# Patient Record
Sex: Female | Born: 1975
Health system: Southern US, Community
[De-identification: ages and names within clinical notes are randomized; demographics above are authoritative.]

## PROBLEM LIST (undated history)

## (undated) DIAGNOSIS — Z973 Presence of spectacles and contact lenses: Secondary | ICD-10-CM

## (undated) DIAGNOSIS — K219 Gastro-esophageal reflux disease without esophagitis: Secondary | ICD-10-CM

## (undated) DIAGNOSIS — T7840XA Allergy, unspecified, initial encounter: Secondary | ICD-10-CM

## (undated) DIAGNOSIS — D649 Anemia, unspecified: Secondary | ICD-10-CM

## (undated) DIAGNOSIS — Z114 Encounter for screening for human immunodeficiency virus [HIV]: Secondary | ICD-10-CM

## (undated) DIAGNOSIS — Z87440 Personal history of urinary (tract) infections: Secondary | ICD-10-CM

## (undated) DIAGNOSIS — Z87442 Personal history of urinary calculi: Secondary | ICD-10-CM

## (undated) HISTORY — DX: Encounter for screening for human immunodeficiency virus (HIV): Z11.4

## (undated) HISTORY — DX: Gastro-esophageal reflux disease without esophagitis: K21.9

## (undated) HISTORY — DX: Personal history of urinary (tract) infections: Z87.440

## (undated) HISTORY — DX: Allergy, unspecified, initial encounter: T78.40XA

---
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
## 2008-03-07 ENCOUNTER — Inpatient Hospital Stay (HOSPITAL_COMMUNITY): Admission: AD | Admit: 2008-03-07 | Discharge: 2008-03-12 | Payer: Self-pay | Admitting: Obstetrics and Gynecology

## 2008-03-09 ENCOUNTER — Encounter (INDEPENDENT_AMBULATORY_CARE_PROVIDER_SITE_OTHER): Payer: Self-pay | Admitting: Obstetrics and Gynecology

## 2010-10-15 NOTE — Discharge Summary (Signed)
Gabrielle Zamora, Gabrielle Zamora                ACCOUNT NO.:  [id number]   MEDICAL RECORD NO.:  [id number]          PATIENT TYPE:  INP   LOCATION:  9148                          FACILITY:  [facility]   PHYSICIAN:  Huel Cote, M.D. DATE OF BIRTH:  1976/02/26   DATE OF ADMISSION:  03/07/2008  DATE OF DISCHARGE:  03/12/2008                               DISCHARGE SUMMARY   DISCHARGE DIAGNOSES:  1. Term pregnancy at 40-3/7 weeks delivered.  2. Status post failed induction with arrest of dilation relatively of      5-cm also nonreassuring fetal heart rate and chorioamnionitis.  3. Status post low transverse cesarean section.   DISCHARGE MEDICATIONS:  1. Motrin 600 mg p.o. over 6 hours.  2. Percocet 1-2 tablets p.o. every 4 hours p.r.n.   DISCHARGE FOLLOWUP:  The patient is to follow up in the office in 2  weeks for an incision check.   HOSPITAL COURSE:  The patient is a 35 year old G2, P0 who came in at 40  plus weeks gestation for attempted induction of labor secondary to  postterm, however, her cervix was somewhat unfavorable, external os was  1, internal os was closed, and she was 50% effaced on admission.  She  came in and was placed on Cervidil to try to ripen her cervix.  For a  full dictated H&P, please see that which was done previously by Dr.  Ellyn Hack.  After her Cervidil ripening, her cervix did improved to 1+,  80%, and -3.  She was given some Stadol for pain control; continued  contracting and reached 2-3 cm, 50%, and -2 and had rupture of membranes  for clear fluid.  She then continued to progress and reached  approximately 4-5 cm.  This was approximately 12 hours and to Pitocin.  At that point, she was started to having some decelerations, 1 notably  down to the 80s.  She also developed a temperature of 101.6, therefore,  was placed on antibiotic prophylaxis for chorio.  Several hours later,  the patient had no cervical change and her temperature remained high  with intermittent fetal  decelerations.  Given her lack of progress and  the less reassuring fetal tracing, the patient was counseled and  underwent a primary low transverse C-section; was delivered a viable  female infant, Apgars were 9 and 9, and weight was 8 pounds 10 ounces.  Cord pH was 7.22.  She underwent the surgery without difficulty and was  placed on ampicillin, gentamicin, and clindamycin for chorioamnionitis,  which she stayed on for approximately 24 hour plus.  She defervesced  very quickly after delivery and never reached spike temperature,  therefore, after 24 hours for antibiotics were discontinued.  Her  hemoglobin was 7.6, which was down just from 9.2 and the patient was  completely asymptomatic with this; therefore, she was counseled as to  her options and just went on some iron therapy, which was felt most  reasonable approach.  She stayed in-house until postop day #3 and at  that point was doing well.  She had no significant complaints.  Her  fundus was  firm.  Her incision was clear with her staples out.  Steri-  Strips were placed and she was felt stable for discharge home.  She is  to follow up in the office in 2 weeks with Dr. Ellyn Hack for an incision  check.     Huel Cote, M.D.  Electronically Signed    KR/MEDQ  D:  03/12/2008  T:  03/12/2008  Job:  [id number]

## 2010-10-15 NOTE — Op Note (Signed)
NAME:  Gabrielle Zamora, Gabrielle Zamora                ACCOUNT NO.:  [id number]   MEDICAL RECORD NO.:  [id number]          PATIENT TYPE:  INP   LOCATION:  9148                          FACILITY:  [facility]   PHYSICIAN:  Sherron Monday, MD        DATE OF BIRTH:  [birth year]   DATE OF PROCEDURE:  03/09/2008  DATE OF DISCHARGE:                               OPERATIVE REPORT   PREOPERATIVE DIAGNOSES:  Intrauterine pregnancy at term, failure to  progress, nonreassuring fetal heart tracing, and chorioamnionitis.   POSTOPERATIVE DIAGNOSES:  Intrauterine pregnancy at term, failure to  progress, nonreassuring fetal heart tracing, and chorioamnionitis;  delivered.   ANESTHESIA:  Epidural.   SURGEON:  Sherron Monday, MD.   PROCEDURE:  Primary low transverse cesarean section.   FINDINGS:  Viable female infant at 2:28 with Apgars of 9 at 1 minute and  9 at 5 minutes and a weight of 8 pounds 10 ounces and an arterial cord  pH of 7.22.  Placenta was delivered intact at 2:30.  Normal uterus,  tubes, and ovaries were noted.   COMPLICATIONS:  None.   PATHOLOGY:  Placenta.   IV FLUIDS:  1800 mL.   EBL:  600 mL.   URINE OUTPUT:  200 mL clear at the end of the procedure.   DISPOSITION:  Stable to PACU.   DESCRIPTION OF PROCEDURE:  After informed consent was reviewed with the  patient including risks, benefits, and alternatives of cesarean section,  she was transported to the OR.  Her epidural was redosed.  She was  placed on the table in supine position with a leftward tilt.  She was  prepped and draped in the normal sterile fashion.  A Pfannenstiel skin  incision was made approximately 2 fingerbreadths above the pubic  symphysis and carried through the underlying layer of fascia sharply.  The fascia was incised in the midline.  The incision was extended  laterally with Mayo scissors.  The inferior aspect of this fascial  incision was grasped with Kocher clamps, elevated, and the rectus  muscles were dissected off both  bluntly and sharply.  Attention was then  turned to the superior portion of the fascial incision was grasped with  Kocher clamps, elevated, and the rectus muscles were dissected off both  bluntly and sharply.  Midline was easily identified and the peritoneum  was entered bluntly.  Peritoneal incision was extended superiorly and  inferiorly with good visualization of bladder.  The Alexis skin  retractor was then placed and carefully checked to make sure no bowel  was entrapped.  The vesicouterine peritoneum was easily identified and a  bladder flap was created with Metzenbaum scissors with blunt and sharp  dissection.  The uterus was incised in transverse fashion.  The infant  was delivered from the vertex presentation.  The cord was clamped and  cut on the field and nose and mouth were suctioned.  The infant was  handed over to the awaiting pediatric staff.  Placenta was then  expressed from the uterus.  Uterus was cleared of all clots and debris.  The uterine  incision was outlined with ring forceps and closed with 2  layers of 0 Monocryl, the first of which was a running locked and the  second was an imbricating.  Hemostasis was assured.  Copious irrigation  of the pelvis was performed.  The subfascial planes were inspected and  found to be hemostatic.  The fascia was then closed with 0 Vicryl in a  running fashion.  Subcuticular adipose layer was made hemostatic with  Bovie cautery and was irrigated, then reapproxaimated with 2-0 plain  gut.  The skin was closed with staples.  Sponge, lap, and needle counts  were correct x2 at the end of procedure.  The patient tolerated the  procedure well.      Sherron Monday, MD  Electronically Signed     JB/MEDQ  D:  03/09/2008  T:  03/09/2008  Job:  [id number]

## 2010-10-15 NOTE — H&P (Signed)
Gabrielle Zamora, Gabrielle Zamora                ACCOUNT NO.:  [id number]   MEDICAL RECORD NO.:  [id number]         PATIENT TYPE:  WHINP   LOCATION:                                 FACILITY:   PHYSICIAN:  Sherron Monday, MD        DATE OF BIRTH:  Jun 14, 1975   DATE OF ADMISSION:  03/07/2008  DATE OF DISCHARGE:                              HISTORY & PHYSICAL   ADMITTING DIAGNOSIS:  Intrauterine pregnancy at 40 plus 3 weeks with  unfavorable cervix for induction of labor.   HISTORY OF PRESENT ILLNESS:  A 35 year old G2, P0 at 40 plus 3 weeks  with induction of labor secondary to post term with unfavorable cervix.  She has had good fetal movement.  No loss of fluid, no vaginal bleeding.  Occasional contractions.  Uncomplicated prenatal care to this point,  except for a previa that resolved in her prenatal care.   PAST MEDICAL HISTORY:  Not significant.   PAST SURGICAL HISTORY:  Significant for an adenoidectomy at age 35.   PAST OBSTETRIC AND GYNECOLOGIC HISTORY:  G1 was a miscarriage.  G2 is  the present pregnancy with an [facility] of March 04, 2008. no abnormal pap  smears or sexually transmitted diseases.   MEDICATIONS:  Prenatal vitamins.   ALLERGIES:  SULFA with a questionable reaction.   SOCIAL HISTORY:  The patient denies alcohol, tobacco or drug use and is  married.   FAMILY HISTORY:  Significant for heart disease in maternal grandfather  and father, chronic hypertension in father, depression in her father, as  well.   PRENATAL LABORATORIES:  Hemoglobin 11.7, platelets 290.  RPR  nonreactive.  Hepatitis B surface antigen negative.  Rubella nonimmune.  HIV nonreactive.  She had an E. coli UTI that was treated.  Pap smear  within normal limits.  She is A+, antibody screen negative, gonorrhea  negative, chlamydia negative, cystic fibrosis screen negative first  trimester screen negative, AFP negative.  One-hour GTT of 131.  Group B  strep was negative.  Ultrasound performed at 27 and 6 weeks  reveals an  estimated fetal weight of 2 pounds 11 ounces with previa being resolved  and a normal AFI.  Ultrasound performed at 19 weeks revealed normal  anatomy except an EIF and anterior placenta with a marginal previa.   PHYSICAL EXAMINATION:  VITAL SIGNS:  Afebrile.  Vital signs stable.  GENERAL:  No apparent distress.  CARDIOVASCULAR:  Regular rate and rhythm.  LUNGS:  Clear to auscultation bilaterally.  ABDOMEN:  Soft,  Fundus nontender.  EXTREMITIES:  Symmetric and nontender.  Fetal heart tones are normal in  the 140s and 150s.  VAGINAL EXAM:  External os is 1 cm, internal os closed, 50% effaced, -2  station and soft.   ASSESSMENT/PLAN:  A 35 year old G2, P0 at 40+ weeks for induction of  labor with Cervidil.  She is group B strep negative, so does not need  group B strep prophylaxis.  She will be admitted, given Cervidil, and  then in the morning when it is removed, an artificial rupture of  membranes will be  performed and Pitocin will be started.  Risks,  benefits and alternatives were discussed with the patient.  Patient  voices understanding to the plan.      Sherron Monday, MD  Electronically Signed     JB/MEDQ  D:  03/07/2008  T:  03/07/2008  Job:  [id number]

## 2011-03-04 LAB — CREATININE, SERUM
Creatinine, Ser: 0.75
GFR calc Af Amer: >60

## 2011-03-04 LAB — CBC
Hemoglobin: 7.6 — CL
MCHC: 33
MCHC: 33.4
MCV: 83.7
Platelets: 299
RBC: 2.7 — ABNORMAL LOW
RDW: 14.1
RDW: 14.6

## 2016-01-01 ENCOUNTER — Other Ambulatory Visit: Payer: Self-pay | Admitting: Physician Assistant

## 2016-01-01 DIAGNOSIS — Z1231 Encounter for screening mammogram for malignant neoplasm of breast: Secondary | ICD-10-CM

## 2016-01-10 ENCOUNTER — Ambulatory Visit: Payer: Self-pay

## 2018-03-24 ENCOUNTER — Encounter: Payer: Self-pay | Admitting: Family Medicine

## 2018-03-24 ENCOUNTER — Ambulatory Visit (INDEPENDENT_AMBULATORY_CARE_PROVIDER_SITE_OTHER): Payer: Self-pay | Admitting: Family Medicine

## 2018-03-24 VITALS — BP 106/74 | HR 69 | Temp 98.2°F | Ht 69.5 in | Wt 169.6 lb

## 2018-03-24 DIAGNOSIS — E559 Vitamin D deficiency, unspecified: Secondary | ICD-10-CM

## 2018-03-24 DIAGNOSIS — Z975 Presence of (intrauterine) contraceptive device: Secondary | ICD-10-CM | POA: Insufficient documentation

## 2018-03-24 DIAGNOSIS — Z0000 Encounter for general adult medical examination without abnormal findings: Secondary | ICD-10-CM | POA: Diagnosis not present

## 2018-03-24 DIAGNOSIS — Z23 Encounter for immunization: Secondary | ICD-10-CM | POA: Diagnosis not present

## 2018-03-24 DIAGNOSIS — Z8249 Family history of ischemic heart disease and other diseases of the circulatory system: Secondary | ICD-10-CM | POA: Diagnosis not present

## 2018-03-24 LAB — COMPREHENSIVE METABOLIC PANEL
ALK PHOS: 54 U/L (ref 39–117)
ALT: 8 U/L (ref 0–35)
AST: 12 U/L (ref 0–37)
Albumin: 4.6 g/dL (ref 3.5–5.2)
BUN: 7 mg/dL (ref 6–23)
CO2: 29 mEq/L (ref 19–32)
Calcium: 9.4 mg/dL (ref 8.4–10.5)
Chloride: 104 mEq/L (ref 96–112)
Creatinine, Ser: 0.7 mg/dL (ref 0.40–1.20)
GFR: 97.48 mL/min (ref >60.00)
GLUCOSE: 97 mg/dL (ref 70–99)
Potassium: 4.4 mEq/L (ref 3.5–5.1)
Sodium: 138 mEq/L (ref 135–145)
TOTAL PROTEIN: 7.3 g/dL (ref 6.0–8.3)
Total Bilirubin: 0.5 mg/dL (ref 0.2–1.2)

## 2018-03-24 LAB — CBC WITH DIFFERENTIAL/PLATELET
BASOS ABS: 0 10*3/uL (ref 0.0–0.1)
BASOS PCT: 0.7 % (ref 0.0–3.0)
EOS PCT: 2.5 % (ref 0.0–5.0)
Eosinophils Absolute: 0.1 10*3/uL (ref 0.0–0.7)
HCT: 39.6 % (ref 36.0–46.0)
Hemoglobin: 13.4 g/dL (ref 12.0–15.0)
LYMPHS ABS: 2.1 10*3/uL (ref 0.7–4.0)
Lymphocytes Relative: 35 % (ref 12.0–46.0)
MCHC: 33.9 g/dL (ref 30.0–36.0)
MCV: 89.6 fl (ref 78.0–100.0)
MONO ABS: 0.4 10*3/uL (ref 0.1–1.0)
MONOS PCT: 6.5 % (ref 3.0–12.0)
NEUTROS PCT: 55.3 % (ref 43.0–77.0)
Neutro Abs: 3.3 10*3/uL (ref 1.4–7.7)
Platelets: 275 10*3/uL (ref 150.0–400.0)
RBC: 4.43 Mil/uL (ref 3.87–5.11)
RDW: 12.7 % (ref 11.5–15.5)
WBC: 5.9 10*3/uL (ref 4.0–10.5)

## 2018-03-24 LAB — LIPID PANEL
Cholesterol: 146 mg/dL (ref 0–200)
HDL: 59 mg/dL (ref >39.00)
LDL Cholesterol: 73 mg/dL (ref 0–99)
NONHDL: 87.21
Total CHOL/HDL Ratio: 2
Triglycerides: 69 mg/dL (ref 0.0–149.0)
VLDL: 13.8 mg/dL (ref 0.0–40.0)

## 2018-03-24 LAB — VITAMIN D 25 HYDROXY (VIT D DEFICIENCY, FRACTURES): VITD: 41.39 ng/mL (ref 30.00–100.00)

## 2018-03-24 LAB — TSH: TSH: 0.37 u[IU]/mL (ref 0.35–4.50)

## 2018-03-24 NOTE — Progress Notes (Signed)
Patient: Gabrielle Zamora MRN: [id number] DOB: 06-23-1975 PCP: Orland Mustard, MD     Subjective:  Chief Complaint  Patient presents with  . Establish Care    HPI: The patient is a 42 y.o. female who presents today for annual exam. She denies any changes to past medical history. There have been no recent hospitalizations. They are following a well balanced diet and exercise plan. Weight has been stable. No complaints today. No FH of breast cancer or colon cancer. Father had a MI and triple bypass in his 79s. She thinks his first heart attack was before the age of 50.   Immunization History  Administered Date(s) Administered  . Tdap 03/24/2018    Mammogram: 01/2017. Normal   Pap smear: 01/2017. Normal  Tdap: today  hiv screen: negative  Flu shot: declined   Review of Systems  Constitutional: Positive for fatigue. Negative for chills and fever.  HENT: Negative for dental problem, ear pain, hearing loss and trouble swallowing.   Eyes: Negative for visual disturbance.  Respiratory: Negative for cough, chest tightness and shortness of breath.   Cardiovascular: Negative for chest pain, palpitations and leg swelling.  Gastrointestinal: Negative for abdominal pain, blood in stool, diarrhea and nausea.  Endocrine: Negative for cold intolerance, polydipsia, polyphagia and polyuria.  Genitourinary: Negative for dysuria and hematuria.  Musculoskeletal: Negative for arthralgias, back pain and neck pain.  Skin: Negative.  Negative for rash.  Neurological: Negative for dizziness and headaches.  Psychiatric/Behavioral: Negative for dysphoric mood and sleep disturbance. The patient is not nervous/anxious.     Allergies Patient has No Known Allergies.  Past Medical History Patient  has a past medical history of Allergy, GERD (gastroesophageal reflux disease), History of recurrent UTIs, and Screening for HIV (human immunodeficiency virus).  Surgical History Patient  has a past surgical history  that includes Cesarean section (2009, 2012) and Adenoidectomy (1985).  Family History Pateint's family history includes Asthma in her daughter, daughter, and son; Depression in her father; Diabetes in her brother; Heart attack in her father and maternal grandfather; Heart disease in her father and maternal grandfather; Hyperlipidemia in her father; Hypertension in her father; Mental illness in her father.  Social History Patient  reports that she has quit smoking. Her smoking use included cigarettes. She has never used smokeless tobacco. She reports that she drinks alcohol. She reports that she does not use drugs.    Objective: Vitals:   03/24/18 0917  BP: 106/74  Pulse: 69  Temp: 98.2 F (36.8 C)  TempSrc: Oral  SpO2: 97%  Weight: 169 lb 9.6 oz (76.9 kg)  Height: 5' 9.5" (1.765 m)    Body mass index is 24.69 kg/m.  Physical Exam  Constitutional: She is oriented to person, place, and time. She appears well-developed and well-nourished.  HENT:  Right Ear: External ear normal.  Left Ear: External ear normal.  Mouth/Throat: Oropharynx is clear and moist.  Eyes: Pupils are equal, round, and reactive to light. Conjunctivae and EOM are normal.  Neck: Normal range of motion. Neck supple. No thyromegaly present.  Cardiovascular: Normal rate, regular rhythm, normal heart sounds and intact distal pulses.  No murmur heard. Pulmonary/Chest: Effort normal and breath sounds normal.  Abdominal: Soft. Bowel sounds are normal. She exhibits no distension. There is no tenderness.  Lymphadenopathy:    She has no cervical adenopathy.  Neurological: She is alert and oriented to person, place, and time. She displays normal reflexes. No cranial nerve deficit. Coordination normal.  Skin: Skin is warm  and dry. No rash noted.  Psychiatric: She has a normal mood and affect. Her behavior is normal.  Vitals reviewed.      Depression screen [facility] 2/9 03/24/2018  Decreased Interest 0  Down, Depressed,  Hopeless 0  PHQ - 2 Score 0    Assessment/plan: 1. Annual physical exam Doing great. Continue healthy lifestyle, but encouraged her to start working out. Routine lab work today. F/u in one year or as needed.  Patient counseling [x]    Nutrition: Stressed importance of moderation in sodium/caffeine intake, saturated fat and cholesterol, caloric balance, sufficient intake of fresh fruits, vegetables, fiber, calcium, iron, and 1 mg of folate supplement per day (for females capable of pregnancy).  [x]    Stressed the importance of regular exercise.   [x]    Substance Abuse: Discussed cessation/primary prevention of tobacco, alcohol, or other drug use; driving or other dangerous activities under the influence; availability of treatment for abuse.   [x]    Injury prevention: Discussed safety belts, safety helmets, smoke detector, smoking near bedding or upholstery.   [x]    Sexuality: Discussed sexually transmitted diseases, partner selection, use of condoms, avoidance of unintended pregnancy  and contraceptive alternatives.  [x]    Dental health: Discussed importance of regular tooth brushing, flossing, and dental visits.  [x]    Health maintenance and immunizations reviewed. Please refer to Health maintenance section.    - CBC with Differential/Platelet - Comprehensive metabolic panel - Lipid panel - TSH  2. Need for prophylactic vaccination with combined diphtheria-tetanus-pertussis (DTP) vaccine  - Tdap vaccine greater than or equal to 7yo IM  3. Vitamin D deficiency  - VITAMIN D 25 Hydroxy (Vit-D Deficiency, Fractures)  4. Family hx of early CAD in father -will find out age and see if we need to refer for this.   Return in about 1 year (around 03/25/2019).    Orland Mustard, MD [facility]  03/24/2018

## 2018-03-25 ENCOUNTER — Other Ambulatory Visit: Payer: Self-pay | Admitting: Family Medicine

## 2018-03-25 DIAGNOSIS — Z8249 Family history of ischemic heart disease and other diseases of the circulatory system: Secondary | ICD-10-CM

## 2018-06-30 ENCOUNTER — Encounter: Payer: Self-pay | Admitting: Podiatry

## 2018-06-30 ENCOUNTER — Ambulatory Visit (INDEPENDENT_AMBULATORY_CARE_PROVIDER_SITE_OTHER): Payer: BLUE CROSS/BLUE SHIELD | Admitting: Podiatry

## 2018-06-30 VITALS — BP 127/83 | HR 67 | Resp 16

## 2018-06-30 DIAGNOSIS — M2041 Other hammer toe(s) (acquired), right foot: Secondary | ICD-10-CM | POA: Diagnosis not present

## 2018-06-30 DIAGNOSIS — B351 Tinea unguium: Secondary | ICD-10-CM | POA: Diagnosis not present

## 2018-06-30 LAB — HEPATIC FUNCTION PANEL
AG RATIO: 1.7 (calc) (ref 1.0–2.5)
ALT: 7 U/L (ref 6–29)
AST: 13 U/L (ref 10–30)
Albumin: 4.5 g/dL (ref 3.6–5.1)
Alkaline phosphatase (APISO): 53 U/L (ref 33–115)
BILIRUBIN INDIRECT: 0.3 mg/dL (ref 0.2–1.2)
BILIRUBIN TOTAL: 0.4 mg/dL (ref 0.2–1.2)
Bilirubin, Direct: 0.1 mg/dL (ref 0.0–0.2)
GLOBULIN: 2.6 g/dL (ref 1.9–3.7)
TOTAL PROTEIN: 7.1 g/dL (ref 6.1–8.1)

## 2018-06-30 MED ORDER — TERBINAFINE HCL 250 MG PO TABS: 250.0000 mg | ORAL_TABLET | Freq: Every day | ORAL | 0 refills | Status: DC

## 2018-06-30 NOTE — Progress Notes (Signed)
   Subjective:    Patient ID: Gabrielle Zamora, female    DOB: 1975-06-22, 43 y.o.   MRN: [id number]  HPI    Review of Systems  All other systems reviewed and are negative.      Objective:   Physical Exam        Assessment & Plan:

## 2018-06-30 NOTE — Progress Notes (Signed)
Subjective:   Patient ID: Gabrielle Zamora, female   DOB: 43 y.o.   MRN: [id number]   HPI Patient states that I had discoloration of my big toenails on both feet and it is been going on for a number years with the right being worse than the left.  Would like to have it treated if possible and also broke her heel bone 3 years ago and has a small digital deformity of her fourth right toe   Review of Systems  All other systems reviewed and are negative.       Objective:  Physical Exam Vitals signs and nursing note reviewed.  Constitutional:      Appearance: She is well-developed.  Pulmonary:     Effort: Pulmonary effort is normal.  Musculoskeletal: Normal range of motion.  Skin:    General: Skin is warm.  Neurological:     Mental Status: She is alert.     Neurovascular status intact muscle strength is adequate range of motion within normal limits with patient noted to have discoloration of the hallux nail bilateral distal two thirds of the bed with mild looseness and lifting of the nail plate itself.  Patient is also noted to have reduced range of motion subtalar joint right and elevated fourth digit right foot with mild discomfort in the sinus tarsi right.  Good digital perfusion well oriented x3     Assessment:  Probability for traumatic nail disease with nail deformity with probability also for mycotic component along with marked subtalar joint arthritis hammertoe deformity     Plan:  H&P all conditions reviewed and education concerning possibility for long-term arthritis given to patient.  As far as the nails I have recommended laser therapy along with oral medication and topical medicine.  I did give instructions for liver function studies and gave her a requisition form for this today and I also wrote her prescription for Lamisil 90 days with instructions on risk associated with it.  Scheduled for laser therapy of the hallux nails in the next week or 2

## 2018-07-01 ENCOUNTER — Ambulatory Visit: Payer: Self-pay

## 2018-07-01 DIAGNOSIS — B351 Tinea unguium: Secondary | ICD-10-CM

## 2018-07-01 DIAGNOSIS — M2041 Other hammer toe(s) (acquired), right foot: Secondary | ICD-10-CM

## 2018-07-06 NOTE — Progress Notes (Signed)
Pt presents with mycotic infection of nails 1-5 bilateral.  All other systems are negative  Laser therapy administered to affected nails and tolerated well. All safety precautions were in place.  2nd treatment.  Follow up in 4 weeks     

## 2018-07-20 ENCOUNTER — Ambulatory Visit (INDEPENDENT_AMBULATORY_CARE_PROVIDER_SITE_OTHER): Payer: BLUE CROSS/BLUE SHIELD | Admitting: Internal Medicine

## 2018-07-20 ENCOUNTER — Encounter: Payer: Self-pay | Admitting: Internal Medicine

## 2018-07-20 VITALS — BP 114/76 | HR 66 | Ht 70.0 in | Wt 169.0 lb

## 2018-07-20 DIAGNOSIS — Z9189 Other specified personal risk factors, not elsewhere classified: Secondary | ICD-10-CM | POA: Diagnosis not present

## 2018-07-20 DIAGNOSIS — Z8249 Family history of ischemic heart disease and other diseases of the circulatory system: Secondary | ICD-10-CM

## 2018-07-20 NOTE — Patient Instructions (Signed)
Your physician recommends that you continue on your current medications as directed. Please refer to the Current Medication list given to you today.  Lab Today: [facility]   Dr. Rennis Golden recommends that you schedule a follow up visit with him the in the [facility] in 12 months. Please have fasting blood work about 1 week prior to this visit and he will review the blood work results with you at your appointment.    Dr.Hilty has ordered a CT coronary calcium score. This test is done at [address]. This is $150 out of pocket.   Coronary CalciumScan A coronary calcium scan is an imaging test used to look for deposits of calcium and other fatty materials (plaques) in the inner lining of the blood vessels of the heart (coronary arteries). These deposits of calcium and plaques can partly clog and narrow the coronary arteries without producing any symptoms or warning signs. This puts a person at risk for a heart attack. This test can detect these deposits before symptoms develop. Tell a health care provider about:  Any allergies you have.  All medicines you are taking, including vitamins, herbs, eye drops, creams, and over-the-counter medicines.  Any problems you or family members have had with anesthetic medicines.  Any blood disorders you have.  Any surgeries you have had.  Any medical conditions you have.  Whether you are pregnant or may be pregnant. What are the risks? Generally, this is a safe procedure. However, problems may occur, including:  Harm to a pregnant woman and her unborn baby. This test involves the use of radiation. Radiation exposure can be dangerous to a pregnant woman and her unborn baby. If you are pregnant, you generally should not have this procedure done.  Slight increase in the risk of cancer. This is because of the radiation involved in the test. What happens before the procedure? No preparation is needed for this procedure. What happens during the  procedure?  You will undress and remove any jewelry around your neck or chest.  You will put on a hospital gown.  Sticky electrodes will be placed on your chest. The electrodes will be connected to an electrocardiogram (ECG) machine to record a tracing of the electrical activity of your heart.  A CT scanner will take pictures of your heart. During this time, you will be asked to lie still and hold your breath for 2-3 seconds while a picture of your heart is being taken. The procedure may vary among health care providers and hospitals. What happens after the procedure?  You can get dressed.  You can return to your normal activities.  It is up to you to get the results of your test. Ask your health care provider, or the department that is doing the test, when your results will be ready. Summary  A coronary calcium scan is an imaging test used to look for deposits of calcium and other fatty materials (plaques) in the inner lining of the blood vessels of the heart (coronary arteries).  Generally, this is a safe procedure. Tell your health care provider if you are pregnant or may be pregnant.  No preparation is needed for this procedure.  A CT scanner will take pictures of your heart.  You can return to your normal activities after the scan is done. This information is not intended to replace advice given to you by your health care provider. Make sure you discuss any questions you have with your health care provider. Document  Released: 11/15/2007 Document Revised: 04/07/2016 Document Reviewed: 04/07/2016 Elsevier Interactive Patient Education  2017 [employer].

## 2018-07-22 ENCOUNTER — Encounter: Payer: Self-pay | Admitting: Internal Medicine

## 2018-07-22 DIAGNOSIS — Z8249 Family history of ischemic heart disease and other diseases of the circulatory system: Secondary | ICD-10-CM | POA: Insufficient documentation

## 2018-07-22 LAB — LIPOPROTEIN A (LPA): Lipoprotein (a): 42.5 nmol/L (ref <75.0)

## 2018-07-22 NOTE — Progress Notes (Signed)
LIPID CLINIC CONSULT NOTE  Chief Complaint:  Referral for family history of coronary disease  Primary Care Physician: Orland Mustard, MD  Primary Cardiologist:  No primary care provider on file.  HPI:  Gabrielle Zamora is a 43 y.o. female who is being seen today for the evaluation of cardiovascular risk at the request of Orland Mustard, MD. This is a pleasant 43 year old female kindly referred to me by Dr. Artis Flock, for evaluation of cardiovascular risk.  Ailany has a strong family history of heart disease including her father who died of heart disease and had high cholesterol at age 43.  In addition her grandfather on her father's side had heart disease and died in his 49s.  She also has a brother who has diabetes.  She had 2 uncles who also died of heart attacks in their 42s.  Her personal risk factors seem to be minimal, and she does not have any history of hypertension, or diabetes.  Her only medication is Adderall, which she takes very infrequently.  Recent lab work from October 2019 showed total cholesterol 146, HDL 59, LDL 73 and triglycerides of 69.  Blood pressure is 114/76 today in the office.  Her BMI is 24.  She reports being physically active and denies any chest pain or worsening shortness of breath with exertion.  PMHx:  Past Medical History:  Diagnosis Date  . Allergy   . GERD (gastroesophageal reflux disease)   . History of recurrent UTIs   . Screening for HIV (human immunodeficiency virus)     Past Surgical History:  Procedure Laterality Date  . ADENOIDECTOMY  1985  . CESAREAN SECTION  2009, 2012    FAMHx:  Family History  Problem Relation Age of Onset  . Depression Father   . Heart attack Father   . Heart disease Father   . Hyperlipidemia Father   . Hypertension Father   . Mental illness Father   . Diabetes Brother   . Asthma Daughter   . Asthma Son   . Heart attack Maternal Grandfather   . Heart disease Maternal Grandfather   . Asthma Daughter      SOCHx:   reports that she has quit smoking. Her smoking use included cigarettes. She has never used smokeless tobacco. She reports current alcohol use. She reports that she does not use drugs.  ALLERGIES:  No Known Allergies  ROS: Pertinent items noted in HPI and remainder of comprehensive ROS otherwise negative.  HOME MEDS: Current Outpatient Medications on File Prior to Visit  Medication Sig Dispense Refill  . amphetamine-dextroamphetamine (ADDERALL) 10 MG tablet Take 10 mg by mouth daily with breakfast.    . Ergocalciferol (VITAMIN D2) 10 MCG (400 UNIT) TABS Vitamin D2    . levonorgestrel (MIRENA, 52 MG,) 20 MCG/24HR IUD Mirena 20 mcg/24 hours (5 yrs) 52 mg intrauterine device  Take 1 device by intrauterine route.    . terbinafine (LAMISIL) 250 MG tablet Take 1 tablet (250 mg total) by mouth daily. 90 tablet 0   No current facility-administered medications on file prior to visit.     LABS/IMAGING: No results found for this or any previous visit (from the past 48 hour(s)). No results found.  LIPID PANEL:    Component Value Date/Time   CHOL 146 03/24/2018 0942   TRIG 69.0 03/24/2018 0942   HDL 59.00 03/24/2018 0942   CHOLHDL 2 03/24/2018 0942   VLDL 13.8 03/24/2018 0942   LDLCALC 73 03/24/2018 0942    WEIGHTS: Wt Readings  from Last 3 Encounters:  07/20/18 169 lb (76.7 kg)  03/24/18 169 lb 9.6 oz (76.9 kg)    VITALS: BP 114/76 (BP Location: Right Arm)   Pulse 66   Ht 5\' 10"  (1.778 m)   Wt 169 lb (76.7 kg)   SpO2 97%   BMI 24.25 kg/m   EXAM: General appearance: alert and no distress Neck: no carotid bruit, no JVD and thyroid not enlarged, symmetric, no tenderness/mass/nodules Lungs: clear to auscultation bilaterally Heart: regular rate and rhythm, S1, S2 normal, no murmur, click, rub or gallop Abdomen: soft, non-tender; bowel sounds normal; no masses,  no organomegaly Extremities: extremities normal, atraumatic, no cyanosis or edema Pulses: 2+ and  symmetric Skin: Skin color, texture, turgor normal. No rashes or lesions Neurologic: Grossly normal Psych: Pleasant  EKG: Deferred  ASSESSMENT: 1. Family history of premature coronary disease 2. At risk for heart disease  PLAN: 1.   [name] has a family history of premature coronary disease and is at risk for heart disease although has no obvious coronary risk factors and is an asymptomatic at this point.  Based on family history alone she is at intermediate risk for coronary disease.  I think she would benefit from coronary artery calcium scoring to further determine her risk.  Her lipid profile appears to be very well controlled with diet.  There is no evidence for hypertension, diabetes and she was a former smoker but quit in 2004.  Given her family history of early onset heart disease, we will also check a LP(a) level.  If this is elevated, she may be a candidate for new therapies that are under clinical development.  We will contact her with the results of the studies and plan follow-up with me in 1 year.  Thanks again for the kind referral.  Chrystie Nose, MD, [facility]  [place]  [facility]  Medical Director of the [facility] Lipid Disorders &  Cardiovascular Risk Reduction Clinic Diplomate of the American Board of Clinical Lipidology Attending Cardiologist  Direct Dial: [phone]  Fax: [phone]  Website:  [email or web address] 07/22/2018, 5:34 PM

## 2018-07-29 ENCOUNTER — Ambulatory Visit: Payer: Self-pay

## 2018-07-29 DIAGNOSIS — B351 Tinea unguium: Secondary | ICD-10-CM

## 2018-07-30 NOTE — Progress Notes (Signed)
Pt presents with mycotic infection of nails 1-5 bilateral.  All other systems are negative  Laser therapy administered to affected nails and tolerated well. All safety precautions were in place.  3rd treatment.  Follow up in 4 weeks     

## 2018-08-01 DIAGNOSIS — Z20828 Contact with and (suspected) exposure to other viral communicable diseases: Secondary | ICD-10-CM | POA: Diagnosis not present

## 2018-08-12 ENCOUNTER — Inpatient Hospital Stay: Admission: RE | Admit: 2018-08-12 | Payer: BLUE CROSS/BLUE SHIELD | Source: Ambulatory Visit

## 2018-08-27 ENCOUNTER — Other Ambulatory Visit: Payer: BLUE CROSS/BLUE SHIELD

## 2018-11-20 DIAGNOSIS — Z20828 Contact with and (suspected) exposure to other viral communicable diseases: Secondary | ICD-10-CM | POA: Diagnosis not present

## 2018-11-20 DIAGNOSIS — R51 Headache: Secondary | ICD-10-CM | POA: Diagnosis not present

## 2019-05-24 DIAGNOSIS — G43909 Migraine, unspecified, not intractable, without status migrainosus: Secondary | ICD-10-CM | POA: Diagnosis not present

## 2019-05-24 DIAGNOSIS — R110 Nausea: Secondary | ICD-10-CM | POA: Diagnosis not present

## 2019-05-24 DIAGNOSIS — Z20828 Contact with and (suspected) exposure to other viral communicable diseases: Secondary | ICD-10-CM | POA: Diagnosis not present

## 2019-06-01 DIAGNOSIS — Z20828 Contact with and (suspected) exposure to other viral communicable diseases: Secondary | ICD-10-CM | POA: Diagnosis not present

## 2019-08-24 ENCOUNTER — Telehealth: Payer: Self-pay | Admitting: Internal Medicine

## 2019-08-24 NOTE — Telephone Encounter (Signed)
LMOM RE: F/U Visit--- AF 

## 2019-09-16 ENCOUNTER — Inpatient Hospital Stay: Admission: RE | Admit: 2019-09-16 | Payer: BLUE CROSS/BLUE SHIELD | Source: Ambulatory Visit

## 2019-09-29 ENCOUNTER — Ambulatory Visit (INDEPENDENT_AMBULATORY_CARE_PROVIDER_SITE_OTHER)
Admission: RE | Admit: 2019-09-29 | Discharge: 2019-09-29 | Disposition: A | Discharge status: Home / Self Care | Payer: Self-pay | Source: Ambulatory Visit | Attending: Internal Medicine | Admitting: Internal Medicine

## 2019-09-29 ENCOUNTER — Other Ambulatory Visit: Payer: Self-pay

## 2019-09-29 DIAGNOSIS — Z8249 Family history of ischemic heart disease and other diseases of the circulatory system: Secondary | ICD-10-CM

## 2019-10-11 DIAGNOSIS — Z20822 Contact with and (suspected) exposure to covid-19: Secondary | ICD-10-CM | POA: Diagnosis not present

## 2019-12-26 ENCOUNTER — Encounter: Payer: Self-pay | Admitting: Physician Assistant

## 2019-12-26 ENCOUNTER — Ambulatory Visit (INDEPENDENT_AMBULATORY_CARE_PROVIDER_SITE_OTHER): Payer: BC Managed Care – PPO | Admitting: Physician Assistant

## 2019-12-26 ENCOUNTER — Other Ambulatory Visit: Payer: Self-pay

## 2019-12-26 VITALS — BP 118/80 | HR 73 | Temp 97.2°F | Ht 70.0 in | Wt 172.2 lb

## 2019-12-26 DIAGNOSIS — L989 Disorder of the skin and subcutaneous tissue, unspecified: Secondary | ICD-10-CM

## 2019-12-26 MED ORDER — TRIAMCINOLONE ACETONIDE 0.1 % EX CREA: TOPICAL_CREAM | CUTANEOUS | 0 refills | Status: DC

## 2019-12-26 NOTE — Progress Notes (Addendum)
Gabrielle Zamora is a 44 y.o. female here for a new problem.  I acted as a Neurosurgeon for [employer], PA-C [employer], LPN   History of Present Illness:   Chief Complaint  Patient presents with  . tick bites    HPI    Tick bites Pt c/o several tick bites since May. Bites are on her R knee, R shoulder, R heel.  She recently purchased a house on a lot of land and is finally getting her yard treated.  She states that her dog also possibly is carrying ticks.  She has tried Neosporin and A&E ointment without significant relief.  She is concerned because the areas are irritated, will improve, and then are irritated again.  Denies fever or chills, headaches, fatigue, malaise, joint pain, neck stiffness, discharge from lesions.  She reports that she was able to remove all the ticks herself without any concerns for them being on for too long of a time frame.    Past Medical History:  Diagnosis Date  . Allergy   . GERD (gastroesophageal reflux disease)   . History of recurrent UTIs   . Screening for HIV (human immunodeficiency virus)      Social History   Tobacco Use  . Smoking status: Former Smoker    Types: Cigarettes  . Smokeless tobacco: Never Used  Vaping Use  . Vaping Use: Never used  Substance Use Topics  . Alcohol use: Yes  . Drug use: Never    Past Surgical History:  Procedure Laterality Date  . ADENOIDECTOMY  1985  . CESAREAN SECTION  2009, 2012    Family History  Problem Relation Age of Onset  . Depression Father   . Heart attack Father   . Heart disease Father   . Hyperlipidemia Father   . Hypertension Father   . Mental illness Father   . Diabetes Brother   . Asthma Daughter   . Asthma Son   . Heart attack Maternal Grandfather   . Heart disease Maternal Grandfather   . Asthma Daughter     No Known Allergies  Current Medications:   Current Outpatient Medications:  .  Ergocalciferol (VITAMIN D2) 10 MCG (400 UNIT) TABS, Vitamin D2, Disp: , Rfl:   .  levonorgestrel (MIRENA, 52 MG,) 20 MCG/24HR IUD, Mirena 20 mcg/24 hours (5 yrs) 52 mg intrauterine device  Take 1 device by intrauterine route., Disp: , Rfl:  .  triamcinolone cream (KENALOG) 0.1 %, Apply to affected area 1-2 times daily, Disp: 30 g, Rfl: 0   Review of Systems:   ROS  Negative unless otherwise specified per HPI.  Vitals:   Vitals:   12/26/19 1538  BP: 118/80  Pulse: 73  Temp: (!) 97.2 F (36.2 C)  TempSrc: Temporal  SpO2: 97%  Weight: 172 lb 4 oz (78.1 kg)  Height: 5\' 10"  (1.778 m)     Body mass index is 24.72 kg/m.  Physical Exam:   Physical Exam Constitutional:      Appearance: She is well-developed.  HENT:     Head: Normocephalic and atraumatic.  Eyes:     Conjunctiva/sclera: Conjunctivae normal.  Pulmonary:     Effort: Pulmonary effort is normal.  Musculoskeletal:        General: Normal range of motion.     Cervical back: Normal range of motion and neck supple.  Skin:    General: Skin is warm and dry.     Comments: Multiple scattered papules with slight scabbing at center, no  tenderness to palpation or evidence of erythema or discharge  Neurological:     Mental Status: She is alert and oriented to person, place, and time.  Psychiatric:        Behavior: Behavior normal.        Thought Content: Thought content normal.        Judgment: Judgment normal.     Assessment and Plan:   Breezie was seen today for tick bites.  Diagnoses and all orders for this visit:  Skin lesion  Other orders -     triamcinolone cream (KENALOG) 0.1 %; Apply to affected area 1-2 times daily   No obvious signs of infection.  Recommended that she trial topical Kenalog to these inflamed areas.  I also offered oral doxycycline to see if this could help clear her skin, but she would like to wait and see if the topical Kenalog helps with this first.  I have asked her to send me a MyChart message if she feels like she needs this.  Worsening precautions  advised.  . Reviewed expectations re: course of current medical issues. . Discussed self-management of symptoms. . Outlined signs and symptoms indicating need for more acute intervention. . Patient verbalized understanding and all questions were answered. . See orders for this visit as documented in the electronic medical record. . Patient received an After-Visit Summary.  CMA or LPN served as scribe during this visit. History, Physical, and Plan performed by medical provider. The above documentation has been reviewed and is accurate and complete.   Jarold Motto, PA-C

## 2019-12-26 NOTE — Patient Instructions (Signed)
It was great to see you!  Start topical triamcinolone ointment the prior bite sites. If no improvement after a week, we can a round of oral doxycycline -- just let me know.  Take care,  Jarold Motto PA-C

## 2020-01-02 ENCOUNTER — Ambulatory Visit: Payer: PRIVATE HEALTH INSURANCE | Admitting: Physician Assistant

## 2020-03-20 DIAGNOSIS — Z20822 Contact with and (suspected) exposure to covid-19: Secondary | ICD-10-CM | POA: Diagnosis not present

## 2020-07-03 DIAGNOSIS — Z8616 Personal history of COVID-19: Secondary | ICD-10-CM

## 2020-07-03 HISTORY — DX: Personal history of COVID-19: Z86.16

## 2020-08-01 ENCOUNTER — Encounter: Payer: Self-pay | Admitting: General Practice

## 2020-08-12 DIAGNOSIS — M545 Low back pain, unspecified: Secondary | ICD-10-CM | POA: Diagnosis not present

## 2020-08-12 DIAGNOSIS — R112 Nausea with vomiting, unspecified: Secondary | ICD-10-CM | POA: Diagnosis not present

## 2020-08-12 DIAGNOSIS — N3001 Acute cystitis with hematuria: Secondary | ICD-10-CM | POA: Diagnosis not present

## 2020-08-12 DIAGNOSIS — R300 Dysuria: Secondary | ICD-10-CM | POA: Diagnosis not present

## 2020-08-17 DIAGNOSIS — N3001 Acute cystitis with hematuria: Secondary | ICD-10-CM | POA: Diagnosis not present

## 2020-08-23 ENCOUNTER — Ambulatory Visit: Payer: BC Managed Care – PPO | Admitting: Physician Assistant

## 2020-08-23 ENCOUNTER — Other Ambulatory Visit: Payer: Self-pay

## 2020-08-23 ENCOUNTER — Encounter: Payer: Self-pay | Admitting: Physician Assistant

## 2020-08-23 VITALS — BP 110/64 | HR 62 | Temp 98.2°F | Ht 70.0 in | Wt 169.0 lb

## 2020-08-23 DIAGNOSIS — R350 Frequency of micturition: Secondary | ICD-10-CM | POA: Diagnosis not present

## 2020-08-23 LAB — POC URINALSYSI DIPSTICK (AUTOMATED)
Bilirubin, UA: NEGATIVE
Blood, UA: NEGATIVE
Glucose, UA: NEGATIVE
Ketones, UA: NEGATIVE
Leukocytes, UA: NEGATIVE
Nitrite, UA: NEGATIVE
Protein, UA: POSITIVE — AB
Spec Grav, UA: 1.02 (ref 1.010–1.025)
Urobilinogen, UA: 1 E.U./dL
pH, UA: 6 (ref 5.0–8.0)

## 2020-08-23 MED ORDER — NITROFURANTOIN MONOHYD MACRO 100 MG PO CAPS: 100.0000 mg | ORAL_CAPSULE | Freq: Two times a day (BID) | ORAL | 0 refills | Status: AC

## 2020-08-23 NOTE — Progress Notes (Signed)
Acute Office Visit  Subjective:    Patient ID: Gabrielle Zamora, female    DOB: [birth year], 45 y.o.   MRN: [id number]  Chief Complaint  Patient presents with  . Urinary Frequency    HPI Patient is in today for urinary frequency.  She has a history of recurrent UTIs.  She was first treated on 08/12/2020 with ciprofloxacin and then again on 08/17/2020 with a continuing course of Cipro for E. coli urinary tract infection.  She states that she is still having frequency and low back pain, but symptoms are improved from the first time when she was feeling nauseated and had a low fever and chills at that time.  She does admit to drinking red bull 2 cans daily.  She has also not had a decent bowel movement in the last week.  Denies any other symptoms at this time.  Past Medical History:  Diagnosis Date  . Allergy   . GERD (gastroesophageal reflux disease)   . History of recurrent UTIs   . Screening for HIV (human immunodeficiency virus)     Past Surgical History:  Procedure Laterality Date  . ADENOIDECTOMY  1985  . CESAREAN SECTION  2009, 2012    Family History  Problem Relation Age of Onset  . Depression Father   . Heart attack Father   . Heart disease Father   . Hyperlipidemia Father   . Hypertension Father   . Mental illness Father   . Diabetes Brother   . Asthma Daughter   . Asthma Son   . Heart attack Maternal Grandfather   . Heart disease Maternal Grandfather   . Asthma Daughter     Social History   Socioeconomic History  . Marital status: Married    Spouse name: Not on file  . Number of children: Not on file  . Years of education: Not on file  . Highest education level: Not on file  Occupational History  . Not on file  Tobacco Use  . Smoking status: Former Smoker    Types: Cigarettes  . Smokeless tobacco: Never Used  Vaping Use  . Vaping Use: Never used  Substance and Sexual Activity  . Alcohol use: Yes  . Drug use: Never  . Sexual activity: Yes  Other  Topics Concern  . Not on file  Social History Narrative  . Not on file   Social Determinants of Health   Financial Resource Strain: Not on file  Food Insecurity: Not on file  Transportation Needs: Not on file  Physical Activity: Not on file  Stress: Not on file  Social Connections: Not on file  Intimate Partner Violence: Not on file    Outpatient Medications Prior to Visit  Medication Sig Dispense Refill  . Ergocalciferol (VITAMIN D2) 10 MCG (400 UNIT) TABS Vitamin D2    . levonorgestrel (MIRENA) 20 MCG/24HR IUD Mirena 20 mcg/24 hours (5 yrs) 52 mg intrauterine device  Take 1 device by intrauterine route.    . triamcinolone cream (KENALOG) 0.1 % Apply to affected area 1-2 times daily 30 g 0   No facility-administered medications prior to visit.    No Known Allergies  Review of Systems REFER TO HPI FOR PERTINENT POSITIVES AND NEGATIVES     Objective:    Physical Exam Vitals and nursing note reviewed.  Constitutional:      General: She is not in acute distress.    Appearance: Normal appearance. She is not ill-appearing.  HENT:     Head: Normocephalic and  atraumatic.  Cardiovascular:     Rate and Rhythm: Normal rate and regular rhythm.     Pulses: Normal pulses.     Heart sounds: Normal heart sounds.  Pulmonary:     Effort: Pulmonary effort is normal.     Breath sounds: Normal breath sounds.  Abdominal:     General: Abdomen is flat. Bowel sounds are normal.     Palpations: Abdomen is soft.     Tenderness: There is no right CVA tenderness or left CVA tenderness.  Skin:    General: Skin is warm and dry.  Neurological:     General: No focal deficit present.     Mental Status: She is alert.  Psychiatric:        Mood and Affect: Mood normal.     BP 110/64   Pulse 62   Temp 98.2 F (36.8 C)   Ht 5\' 10"  (1.778 m)   Wt 169 lb (76.7 kg)   SpO2 97%   BMI 24.25 kg/m  Wt Readings from Last 3 Encounters:  08/23/20 169 lb (76.7 kg)  12/26/19 172 lb 4 oz (78.1  kg)  07/20/18 169 lb (76.7 kg)    Health Maintenance Due  Topic Date Due  . Hepatitis C Screening  Never done  . COVID-19 Vaccine (1) Never done  . INFLUENZA VACCINE  Never done  . PAP SMEAR-Modifier  02/18/2020    There are no preventive care reminders to display for this patient.   Lab Results  Component Value Date   TSH 0.37 03/24/2018   Lab Results  Component Value Date   WBC 5.9 03/24/2018   HGB 13.4 03/24/2018   HCT 39.6 03/24/2018   MCV 89.6 03/24/2018   PLT 275.0 03/24/2018   Lab Results  Component Value Date   NA 138 03/24/2018   K 4.4 03/24/2018   CO2 29 03/24/2018   GLUCOSE 97 03/24/2018   BUN 7 03/24/2018   CREATININE 0.70 03/24/2018   BILITOT 0.4 06/30/2018   ALKPHOS 54 03/24/2018   AST 13 06/30/2018   ALT 7 06/30/2018   PROT 7.1 06/30/2018   ALBUMIN 4.6 03/24/2018   CALCIUM 9.4 03/24/2018   GFR 97.48 03/24/2018   Lab Results  Component Value Date   CHOL 146 03/24/2018   Lab Results  Component Value Date   HDL 59.00 03/24/2018   Lab Results  Component Value Date   LDLCALC 73 03/24/2018   Lab Results  Component Value Date   TRIG 69.0 03/24/2018   Lab Results  Component Value Date   CHOLHDL 2 03/24/2018   No results found for: HGBA1C     Assessment & Plan:   Problem List Items Addressed This Visit   None   Visit Diagnoses    Urinary frequency    -  Primary   Relevant Orders   POCT Urinalysis Dipstick (Automated) (Completed)     1. Urinary frequency It is possible that she still has E. coli urinary tract infection lingering.  I checked the biogram in our area and will start patient on my Macrobid over the weekend as she is still having symptoms.  Also advised that she needs to cut out caffeine and push more water.  May try cranberry tablets or juice once daily.  She also needs to alleviate her constipation which could be contributing.  She may try fiber or MiraLAX.  Return to the office if still worse or no improvement of  symptoms.  This note was prepared with assistance  of Conservation officer, historic buildings. Occasional wrong-word or sound-a-like substitutions may have occurred due to the inherent limitations of voice recognition software. This visit occurred during the SARS-CoV-2 public health emergency.  Safety protocols were in place, including screening questions prior to the visit, additional usage of staff PPE, and extensive cleaning of exam room while observing appropriate contact time as indicated for disinfecting solutions.      Vinette Crites M Garet Hooton, PA-C

## 2020-08-23 NOTE — Patient Instructions (Signed)
Please take medications as directed.  Stop caffeine use and drink more water.  May try MiraLAX or fiber to help with constipation issues.  I will call or send MyChart with urine culture results.

## 2020-08-24 LAB — URINE CULTURE
MICRO NUMBER:: 11688126
Result:: NO GROWTH
SPECIMEN QUALITY:: ADEQUATE

## 2020-08-31 DIAGNOSIS — Z20822 Contact with and (suspected) exposure to covid-19: Secondary | ICD-10-CM | POA: Diagnosis not present

## 2021-01-11 DIAGNOSIS — R0781 Pleurodynia: Secondary | ICD-10-CM | POA: Diagnosis not present

## 2021-01-16 DIAGNOSIS — R0781 Pleurodynia: Secondary | ICD-10-CM | POA: Diagnosis not present

## 2021-02-09 DIAGNOSIS — R339 Retention of urine, unspecified: Secondary | ICD-10-CM | POA: Diagnosis not present

## 2021-02-09 DIAGNOSIS — R3915 Urgency of urination: Secondary | ICD-10-CM | POA: Diagnosis not present

## 2021-02-09 DIAGNOSIS — R350 Frequency of micturition: Secondary | ICD-10-CM | POA: Diagnosis not present

## 2021-02-27 ENCOUNTER — Ambulatory Visit: Payer: BC Managed Care – PPO | Admitting: Physician Assistant

## 2021-02-27 ENCOUNTER — Other Ambulatory Visit: Payer: Self-pay

## 2021-02-27 ENCOUNTER — Encounter: Payer: BC Managed Care – PPO | Admitting: Physician Assistant

## 2021-02-27 ENCOUNTER — Encounter: Payer: Self-pay | Admitting: Physician Assistant

## 2021-02-27 VITALS — BP 123/79 | HR 70 | Temp 97.0°F | Ht 70.0 in | Wt 165.2 lb

## 2021-02-27 DIAGNOSIS — R309 Painful micturition, unspecified: Secondary | ICD-10-CM | POA: Diagnosis not present

## 2021-02-27 DIAGNOSIS — Z131 Encounter for screening for diabetes mellitus: Secondary | ICD-10-CM

## 2021-02-27 DIAGNOSIS — Z1211 Encounter for screening for malignant neoplasm of colon: Secondary | ICD-10-CM | POA: Diagnosis not present

## 2021-02-27 DIAGNOSIS — E559 Vitamin D deficiency, unspecified: Secondary | ICD-10-CM | POA: Diagnosis not present

## 2021-02-27 DIAGNOSIS — Z1231 Encounter for screening mammogram for malignant neoplasm of breast: Secondary | ICD-10-CM

## 2021-02-27 DIAGNOSIS — Z13220 Encounter for screening for lipoid disorders: Secondary | ICD-10-CM

## 2021-02-27 DIAGNOSIS — Z0000 Encounter for general adult medical examination without abnormal findings: Secondary | ICD-10-CM

## 2021-02-27 LAB — COMPREHENSIVE METABOLIC PANEL
ALT: 32 U/L (ref 0–35)
AST: 27 U/L (ref 0–37)
Albumin: 4.3 g/dL (ref 3.5–5.2)
Alkaline Phosphatase: 65 U/L (ref 39–117)
BUN: 11 mg/dL (ref 6–23)
CO2: 29 mEq/L (ref 19–32)
Calcium: 9.5 mg/dL (ref 8.4–10.5)
Chloride: 104 mEq/L (ref 96–112)
Creatinine, Ser: 0.73 mg/dL (ref 0.40–1.20)
GFR: 99.59 mL/min (ref >60.00)
Glucose, Bld: 81 mg/dL (ref 70–99)
Potassium: 4.2 mEq/L (ref 3.5–5.1)
Sodium: 140 mEq/L (ref 135–145)
Total Bilirubin: 0.5 mg/dL (ref 0.2–1.2)
Total Protein: 7.3 g/dL (ref 6.0–8.3)

## 2021-02-27 LAB — POC URINALSYSI DIPSTICK (AUTOMATED)
Bilirubin, UA: NEGATIVE
Blood, UA: POSITIVE
Glucose, UA: NEGATIVE
Ketones, UA: NEGATIVE
Leukocytes, UA: NEGATIVE
Nitrite, UA: NEGATIVE
Protein, UA: NEGATIVE
Spec Grav, UA: 1.02 (ref 1.010–1.025)
Urobilinogen, UA: 0.2 E.U./dL
pH, UA: 6.5 (ref 5.0–8.0)

## 2021-02-27 LAB — VITAMIN D 25 HYDROXY (VIT D DEFICIENCY, FRACTURES): VITD: 23.57 ng/mL — ABNORMAL LOW (ref 30.00–100.00)

## 2021-02-27 LAB — CBC WITH DIFFERENTIAL/PLATELET
Basophils Absolute: 0.1 10*3/uL (ref 0.0–0.1)
Basophils Relative: 0.9 % (ref 0.0–3.0)
Eosinophils Absolute: 0.2 10*3/uL (ref 0.0–0.7)
Eosinophils Relative: 2.6 % (ref 0.0–5.0)
HCT: 37.3 % (ref 36.0–46.0)
Hemoglobin: 12.3 g/dL (ref 12.0–15.0)
Lymphocytes Relative: 36.9 % (ref 12.0–46.0)
Lymphs Abs: 2.2 10*3/uL (ref 0.7–4.0)
MCHC: 33.1 g/dL (ref 30.0–36.0)
MCV: 88.3 fl (ref 78.0–100.0)
Monocytes Absolute: 0.5 10*3/uL (ref 0.1–1.0)
Monocytes Relative: 7.5 % (ref 3.0–12.0)
Neutro Abs: 3.1 10*3/uL (ref 1.4–7.7)
Neutrophils Relative %: 52.1 % (ref 43.0–77.0)
Platelets: 319 10*3/uL (ref 150.0–400.0)
RBC: 4.22 Mil/uL (ref 3.87–5.11)
RDW: 13 % (ref 11.5–15.5)
WBC: 6 10*3/uL (ref 4.0–10.5)

## 2021-02-27 LAB — LIPID PANEL
Cholesterol: 171 mg/dL (ref 0–200)
HDL: 64.1 mg/dL (ref >39.00)
LDL Cholesterol: 93 mg/dL (ref 0–99)
NonHDL: 106.83
Total CHOL/HDL Ratio: 3
Triglycerides: 69 mg/dL (ref 0.0–149.0)
VLDL: 13.8 mg/dL (ref 0.0–40.0)

## 2021-02-27 NOTE — Patient Instructions (Addendum)
Good to see you again. Please go to the lab for blood work and I will send results through MyChart. Referrals for colonoscopy and mammogram placed.

## 2021-02-27 NOTE — Progress Notes (Signed)
Subjective:    Patient ID: Gabrielle Zamora, female    DOB: [birth year], 45 y.o.   MRN: [id number]  Chief Complaint  Patient presents with   Annual Exam   Dysuria    HPI Patient is in today for annual exam and TOC.  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; married, 71 yo daughter and twin 69 yo boys Nutrition: Vegetarian, eats eggs and also does vegetarian protein  Caffeine: Usually RedB[place], but cutting this out due to urinary concerns  Sleep: Light-sleeper, but doing well  Substance use: None Alcohol: Very rarely  Sexual activity: Married, monogamous Immunizations: UTD, declines flu vaccine  Colonoscopy: Due this year Pap: Sees GYN  Mammogram: Due this year     Past Medical History:  Diagnosis Date   Allergy    GERD (gastroesophageal reflux disease)    History of recurrent UTIs    Screening for HIV (human immunodeficiency virus)     Past Surgical History:  Procedure Laterality Date   ADENOIDECTOMY  53   CESAREAN SECTION  2009, 2012    Family History  Problem Relation Age of Onset   Depression Father    Heart attack Father    Heart disease Father    Hyperlipidemia Father    Hypertension Father    Mental illness Father    Diabetes Brother    Asthma Daughter    Asthma Son    Heart attack Maternal Grandfather    Heart disease Maternal Grandfather    Asthma Daughter     Social History   Tobacco Use   Smoking status: Former    Types: Cigarettes   Smokeless tobacco: Never  Vaping Use   Vaping Use: Never used  Substance Use Topics   Alcohol use: Yes   Drug use: Never     No Known Allergies  Review of Systems  Constitutional:  Negative for activity change, appetite change, fever and unexpected weight change.  HENT:  Negative for congestion.   Eyes:  Negative for visual disturbance.  Respiratory:  Negative for apnea,  cough and shortness of breath.   Cardiovascular:  Negative for chest pain, palpitations and leg swelling.  Gastrointestinal:  Negative for abdominal pain, blood in stool, constipation and diarrhea.  Endocrine: Negative for polydipsia, polyphagia and polyuria.  Genitourinary:  Negative for dysuria and pelvic pain.  Musculoskeletal:  Negative for arthralgias.  Skin:  Negative for rash.  Neurological:  Negative for dizziness, weakness and headaches.  Hematological:  Negative for adenopathy. Does not bruise/bleed easily.  Psychiatric/Behavioral:  Negative for sleep disturbance and suicidal ideas. The patient is not nervous/anxious.        Objective:     BP 123/79   Pulse 70   Temp (!) 97 F (36.1 C)   Ht 5\' 10"  (1.778 m)   Wt 165 lb 4 oz (75 kg)   SpO2 98%   BMI 23.71 kg/m   Wt Readings from Last 3 Encounters:  02/27/21 165 lb 4 oz (75 kg)  08/23/20 169 lb (76.7 kg)  12/26/19 172 lb 4 oz (78.1 kg)    BP Readings from Last 3 Encounters:  02/27/21 123/79  08/23/20 110/64  12/26/19 118/80     Physical Exam Vitals and nursing note reviewed.  Constitutional:      Appearance: Normal appearance. She is normal weight. She is not toxic-appearing.  HENT:     Head: Normocephalic  and atraumatic.     Right Ear: Tympanic membrane, ear canal and external ear normal.     Left Ear: Tympanic membrane, ear canal and external ear normal.     Nose: Nose normal.     Mouth/Throat:     Mouth: Mucous membranes are moist.  Eyes:     Extraocular Movements: Extraocular movements intact.     Conjunctiva/sclera: Conjunctivae normal.     Pupils: Pupils are equal, round, and reactive to light.  Cardiovascular:     Rate and Rhythm: Normal rate and regular rhythm.     Pulses: Normal pulses.     Heart sounds: Normal heart sounds.  Pulmonary:     Effort: Pulmonary effort is normal.     Breath sounds: Normal breath sounds.  Abdominal:     General: Abdomen is flat. Bowel sounds are normal.      Palpations: Abdomen is soft.     Tenderness: There is no right CVA tenderness or left CVA tenderness.  Musculoskeletal:        General: Normal range of motion.     Cervical back: Normal range of motion and neck supple.  Skin:    General: Skin is warm and dry.  Neurological:     General: No focal deficit present.     Mental Status: She is alert and oriented to person, place, and time.  Psychiatric:        Mood and Affect: Mood normal.        Behavior: Behavior normal.        Thought Content: Thought content normal.        Judgment: Judgment normal.       Assessment & Plan:   Problem List Items Addressed This Visit   None Visit Diagnoses     Encounter for annual physical exam    -  Primary   Relevant Orders   CBC with Differential/Platelet   Comprehensive metabolic panel   Lipid panel   VITAMIN D 25 Hydroxy (Vit-D Deficiency, Fractures)   Encounter for screening mammogram for malignant neoplasm of breast       Relevant Orders   MM 3D SCREEN BREAST BILATERAL   Screening for colon cancer       Relevant Orders   Ambulatory referral to Gastroenterology   Diabetes mellitus screening       Relevant Orders   Comprehensive metabolic panel   Screening for cholesterol level       Relevant Orders   Lipid panel   Painful urination       Relevant Orders   POCT Urinalysis Dipstick (Automated) (Completed)   Vitamin D deficiency       Relevant Orders   VITAMIN D 25 Hydroxy (Vit-D Deficiency, Fractures)       Age-appropriate screening and counseling performed today. Will check labs and call with results. Preventive measures discussed and printed in AVS for patient. She will call GYN to schedule pap. I will schedule for mammogram and screening colonoscopy. Encouraged increasing cardio / weight exercise and praised her for good nutrition habits.    Dorlisa Savino M Kameisha Malicki, PA-C

## 2021-03-03 ENCOUNTER — Other Ambulatory Visit: Payer: Self-pay | Admitting: Physician Assistant

## 2021-03-03 MED ORDER — VITAMIN D (ERGOCALCIFEROL) 1.25 MG (50000 UNIT) PO CAPS: 50000.0000 [IU] | ORAL_CAPSULE | ORAL | 0 refills | Status: DC

## 2021-03-07 DIAGNOSIS — N390 Urinary tract infection, site not specified: Secondary | ICD-10-CM | POA: Diagnosis not present

## 2021-03-07 DIAGNOSIS — N200 Calculus of kidney: Secondary | ICD-10-CM | POA: Diagnosis not present

## 2021-04-19 ENCOUNTER — Ambulatory Visit
Admission: RE | Admit: 2021-04-19 | Discharge: 2021-04-19 | Disposition: A | Discharge status: Home / Self Care | Payer: BC Managed Care – PPO | Source: Ambulatory Visit | Attending: Physician Assistant | Admitting: Physician Assistant

## 2021-04-19 ENCOUNTER — Other Ambulatory Visit: Payer: Self-pay

## 2021-04-19 DIAGNOSIS — Z1231 Encounter for screening mammogram for malignant neoplasm of breast: Secondary | ICD-10-CM | POA: Diagnosis not present

## 2021-05-29 DIAGNOSIS — J180 Bronchopneumonia, unspecified organism: Secondary | ICD-10-CM | POA: Diagnosis not present

## 2021-05-31 DIAGNOSIS — J189 Pneumonia, unspecified organism: Secondary | ICD-10-CM

## 2021-05-31 HISTORY — DX: Pneumonia, unspecified organism: J18.9

## 2021-06-12 ENCOUNTER — Ambulatory Visit: Payer: BC Managed Care – PPO | Admitting: Family Medicine

## 2021-06-12 ENCOUNTER — Encounter: Payer: Self-pay | Admitting: Family Medicine

## 2021-06-12 ENCOUNTER — Other Ambulatory Visit: Payer: Self-pay

## 2021-06-12 VITALS — BP 122/70 | HR 71 | Temp 98.2°F | Ht 66.0 in | Wt 170.5 lb

## 2021-06-12 DIAGNOSIS — R3989 Other symptoms and signs involving the genitourinary system: Secondary | ICD-10-CM

## 2021-06-12 DIAGNOSIS — R110 Nausea: Secondary | ICD-10-CM

## 2021-06-12 DIAGNOSIS — N200 Calculus of kidney: Secondary | ICD-10-CM

## 2021-06-12 DIAGNOSIS — N132 Hydronephrosis with renal and ureteral calculous obstruction: Secondary | ICD-10-CM | POA: Diagnosis not present

## 2021-06-12 LAB — POCT URINALYSIS DIPSTICK
Bilirubin, UA: NEGATIVE
Blood, UA: POSITIVE
Glucose, UA: NEGATIVE
Ketones, UA: NEGATIVE
Nitrite, UA: NEGATIVE
Protein, UA: NEGATIVE
Spec Grav, UA: >=1.03 — AB (ref 1.010–1.025)
Urobilinogen, UA: 0.2 E.U./dL
pH, UA: 6 (ref 5.0–8.0)

## 2021-06-12 LAB — POCT URINE PREGNANCY: Preg Test, Ur: NEGATIVE

## 2021-06-12 MED ORDER — TAMSULOSIN HCL 0.4 MG PO CAPS: 0.4000 mg | ORAL_CAPSULE | Freq: Every day | ORAL | 0 refills | Status: DC

## 2021-06-12 MED ORDER — SULFAMETHOXAZOLE-TRIMETHOPRIM 800-160 MG PO TABS: 1.0000 | ORAL_TABLET | Freq: Two times a day (BID) | ORAL | 0 refills | Status: AC

## 2021-06-12 NOTE — Patient Instructions (Signed)
°  You can take tylenol for pain/fevers °If worsening symptoms, let us know or go to the Emergency room  °

## 2021-06-12 NOTE — Progress Notes (Signed)
Subjective:     Patient ID: Gabrielle Zamora, female    DOB: Nov 16, 1975, 46 y.o.   MRN: [id number]  Chief Complaint  Patient presents with   Bladder pressure    Felt like labor pains on Saturday  Possible kidney stones    Nausea    Very nauseous on Monday    HPI UTI dx March, Sept, Oct Last wk, pressure to urinate and hard to go-lasted 2 hrs.  Soreness back and resolved Recurred  Sunday night for sev hours(1/8).  Lasted sev hours Lower abd pain and lower back-can be severe.  No hematuria Nausea 2 days ago so took meds  Dx Pneumonia 12/28 and just finished abx Augmentin  No f/c/dysuria/v/d/c.  Health Maintenance Due  Topic Date Due   Hepatitis C Screening  Never done   PAP SMEAR-Modifier  02/18/2020   COLONOSCOPY (Pts 45-42yrs Insurance coverage will need to be confirmed)  Never done    Past Medical History:  Diagnosis Date   Allergy    GERD (gastroesophageal reflux disease)    History of recurrent UTIs    Screening for HIV (human immunodeficiency virus)     Past Surgical History:  Procedure Laterality Date   ADENOIDECTOMY  75   CESAREAN SECTION  2009, 2012    Outpatient Medications Prior to Visit  Medication Sig Dispense Refill   amoxicillin-clavulanate (AUGMENTIN) 875-125 MG tablet SMARTSIG:1 Tablet(s) By Mouth Every 12 Hours     levonorgestrel (MIRENA) 20 MCG/24HR IUD Mirena 20 mcg/24 hours (5 yrs) 52 mg intrauterine device  Take 1 device by intrauterine route.     Vitamin D, Ergocalciferol, (DRISDOL) 1.25 MG (50000 UNIT) CAPS capsule Take 1 capsule (50,000 Units total) by mouth every 7 (seven) days. 12 capsule 0   albuterol (VENTOLIN HFA) 108 (90 Base) MCG/ACT inhaler Inhale 2 puffs into the lungs every 4 (four) hours as needed. (Patient not taking: Reported on 06/12/2021)     Ergocalciferol (VITAMIN D2) 10 MCG (400 UNIT) TABS Vitamin D2     No facility-administered medications prior to visit.    No Known Allergies [id number] except as  noted in HPI      Objective:     BP 122/70    Pulse 71    Temp 98.2 F (36.8 C) (Temporal)    Ht 5\' 6"  (1.676 m)    Wt 170 lb 8 oz (77.3 kg)    SpO2 97%    BMI 27.52 kg/m  Wt Readings from Last 3 Encounters:  06/12/21 170 lb 8 oz (77.3 kg)  02/27/21 165 lb 4 oz (75 kg)  08/23/20 169 lb (76.7 kg)        Gen: WDWN NAD WF HEENT: NCAT, conjunctiva not injected, sclera nonicteric NECK:  supple, no thyromegaly, no nodes, no carotid bruits CARDIAC: RRR, S1S2+, no murmur. DP 2+B LUNGS: CTAB. No wheezes ABDOMEN:  BS+, soft, sl tender lower abd. No HSM, no masses. No CVAT. EXT:  no edema MSK: no gross abnormalities.  NEURO: A&O x3.  CN II-XII intact.  PSYCH: normal mood. Good eye contact  Results for orders placed or performed in visit on 06/12/21  POCT urinalysis dipstick  Result Value Ref Range   Color, UA DARK YELLOW    Clarity, UA CLEAR    Glucose, UA Negative Negative   Bilirubin, UA NEGATIVE    Ketones, UA NEGATIVE    Spec Grav, UA >=1.030 (A) 1.010 - 1.025   Blood, UA POSITIVE    pH, UA 6.0 5.0 - 8.0  Protein, UA Negative Negative   Urobilinogen, UA 0.2 0.2 or 1.0 E.U./dL   Nitrite, UA NEGATIVE    Leukocytes, UA Small (1+) (A) Negative   Appearance CLEAR    Odor YES   POCT urine pregnancy  Result Value Ref Range   Preg Test, Ur Negative Negative     Assessment & Plan:   Problem List Items Addressed This Visit   None Visit Diagnoses     Sensation of pressure in bladder area    -  Primary   Relevant Orders   POCT urinalysis dipstick (Completed)   Urine Culture   POCT urine pregnancy (Completed)   CT Abdomen Pelvis Wo Contrast   CBC   Comprehensive metabolic panel   Nausea       Relevant Orders   POCT urine pregnancy (Completed)       Bladder pressure-some rbc/wbc.  This is 4th episode.  Urine cx neg in past.  ? Stone, IUD wrong place, other.  Check CT.  Will do bactrim to cover resistant bacteria, but just finished augmentin. Worse pain,  intractable-to ER Nausea-prob d/t pain.  Meds prn  Meds ordered this encounter  Medications   tamsulosin (FLOMAX) 0.4 MG CAPS capsule    Sig: Take 1 capsule (0.4 mg total) by mouth daily.    Dispense:  30 capsule    Refill:  0   sulfamethoxazole-trimethoprim (BACTRIM DS) 800-160 MG tablet    Sig: Take 1 tablet by mouth 2 (two) times daily for 3 days.    Dispense:  6 tablet    Refill:  0    Wellington Hampshire, MD

## 2021-06-13 ENCOUNTER — Ambulatory Visit (HOSPITAL_BASED_OUTPATIENT_CLINIC_OR_DEPARTMENT_OTHER): Payer: BC Managed Care – PPO

## 2021-06-13 LAB — COMPREHENSIVE METABOLIC PANEL
ALT: 19 U/L (ref 0–35)
AST: 22 U/L (ref 0–37)
Albumin: 3.7 g/dL (ref 3.5–5.2)
Alkaline Phosphatase: 50 U/L (ref 39–117)
BUN: 17 mg/dL (ref 6–23)
CO2: 29 mEq/L (ref 19–32)
Calcium: 8.7 mg/dL (ref 8.4–10.5)
Chloride: 105 mEq/L (ref 96–112)
Creatinine, Ser: 0.97 mg/dL (ref 0.40–1.20)
GFR: 70.66 mL/min (ref >60.00)
Glucose, Bld: 105 mg/dL — ABNORMAL HIGH (ref 70–99)
Potassium: 4.2 mEq/L (ref 3.5–5.1)
Sodium: 140 mEq/L (ref 135–145)
Total Bilirubin: 0.4 mg/dL (ref 0.2–1.2)
Total Protein: 6.8 g/dL (ref 6.0–8.3)

## 2021-06-13 LAB — CBC
HCT: 33.7 % — ABNORMAL LOW (ref 36.0–46.0)
Hemoglobin: 11.1 g/dL — ABNORMAL LOW (ref 12.0–15.0)
MCHC: 32.8 g/dL (ref 30.0–36.0)
MCV: 88.3 fl (ref 78.0–100.0)
Platelets: 356 10*3/uL (ref 150.0–400.0)
RBC: 3.82 Mil/uL — ABNORMAL LOW (ref 3.87–5.11)
RDW: 14.1 % (ref 11.5–15.5)
WBC: 6.7 10*3/uL (ref 4.0–10.5)

## 2021-06-13 LAB — URINE CULTURE
MICRO NUMBER:: 12857113
SPECIMEN QUALITY:: ADEQUATE

## 2021-06-15 ENCOUNTER — Other Ambulatory Visit: Payer: Self-pay

## 2021-06-15 ENCOUNTER — Ambulatory Visit (HOSPITAL_BASED_OUTPATIENT_CLINIC_OR_DEPARTMENT_OTHER)
Admission: RE | Admit: 2021-06-15 | Discharge: 2021-06-15 | Disposition: A | Discharge status: Home / Self Care | Payer: BC Managed Care – PPO | Source: Ambulatory Visit | Attending: Family Medicine | Admitting: Family Medicine

## 2021-06-15 DIAGNOSIS — R3989 Other symptoms and signs involving the genitourinary system: Secondary | ICD-10-CM | POA: Insufficient documentation

## 2021-06-15 DIAGNOSIS — N132 Hydronephrosis with renal and ureteral calculous obstruction: Secondary | ICD-10-CM | POA: Diagnosis not present

## 2021-06-15 DIAGNOSIS — D7389 Other diseases of spleen: Secondary | ICD-10-CM | POA: Diagnosis not present

## 2021-06-16 NOTE — Addendum Note (Signed)
Addended by: Angelena Sole on: 06/16/2021 12:22 PM   Modules accepted: Orders

## 2021-06-20 DIAGNOSIS — N132 Hydronephrosis with renal and ureteral calculous obstruction: Secondary | ICD-10-CM | POA: Diagnosis not present

## 2021-06-20 DIAGNOSIS — R8271 Bacteriuria: Secondary | ICD-10-CM | POA: Diagnosis not present

## 2021-06-25 ENCOUNTER — Other Ambulatory Visit: Payer: Self-pay | Admitting: Urology

## 2021-06-26 ENCOUNTER — Encounter (HOSPITAL_BASED_OUTPATIENT_CLINIC_OR_DEPARTMENT_OTHER): Payer: Self-pay | Admitting: Urology

## 2021-06-26 ENCOUNTER — Other Ambulatory Visit: Payer: Self-pay

## 2021-06-26 NOTE — Progress Notes (Signed)
Spoke w/ via phone for pre-op interview---pt Lab needs dos----   urine preg poct            Lab results------cbc cmp 06-12-2021 epic COVID test -----patient states asymptomatic no test needed Arrive at -------1215 pm 06-28-2021 NPO after MN NO Solid Food.  Clear liquids from MN until---1115 am Med rec completed Medications to take morning of surgery -----none Diabetic medication -----n/a Patient instructed no nail polish to be worn day of surgery Patient instructed to bring photo id and insurance card day of surgery Patient aware to have Driver (ride ) / caregiver    for 24 hours after surgery casey spouse cell [phone] Patient Special Instructions -----none Pre-Op special Istructions -----none Patient verbalized understanding of instructions that were given at this phone interview. Patient denies shortness of breath, chest pain, fever, cough at this phone interview.   Spoke with dr Jairo Ben mda and made aware pt had pneumonia December 30 th 2022 and finished antibiotics January  4th 2023, pt states no symptoms since January 4th 2023 and can climb stairs without difficulty and can lie flat and no sob. Pt ok for 06-28-2021 surgery at [facility] per dr c Jean Rosenthal mda.

## 2021-06-27 NOTE — Progress Notes (Signed)
Dr Mayer Masker office requesting surgery time change to 1315. Gabrielle Zamora Museum/gallery conservator) states she will notify patient of time change. To arrive at 1115, clear liquids until 1015.

## 2021-06-28 ENCOUNTER — Ambulatory Visit (HOSPITAL_BASED_OUTPATIENT_CLINIC_OR_DEPARTMENT_OTHER)
Admission: RE | Admit: 2021-06-28 | Discharge: 2021-06-28 | Disposition: A | Discharge status: Home / Self Care | Payer: BC Managed Care – PPO | Attending: Urology | Admitting: Urology

## 2021-06-28 ENCOUNTER — Encounter (HOSPITAL_BASED_OUTPATIENT_CLINIC_OR_DEPARTMENT_OTHER): Payer: Self-pay | Admitting: Urology

## 2021-06-28 ENCOUNTER — Ambulatory Visit (HOSPITAL_BASED_OUTPATIENT_CLINIC_OR_DEPARTMENT_OTHER): Payer: BC Managed Care – PPO | Admitting: Anesthesiology

## 2021-06-28 ENCOUNTER — Encounter (HOSPITAL_BASED_OUTPATIENT_CLINIC_OR_DEPARTMENT_OTHER)
Admission: RE | Disposition: A | Discharge status: Home / Self Care | Payer: Self-pay | Source: Home / Self Care | Attending: Urology

## 2021-06-28 ENCOUNTER — Other Ambulatory Visit: Payer: Self-pay

## 2021-06-28 DIAGNOSIS — Z87891 Personal history of nicotine dependence: Secondary | ICD-10-CM | POA: Insufficient documentation

## 2021-06-28 DIAGNOSIS — N201 Calculus of ureter: Secondary | ICD-10-CM | POA: Diagnosis not present

## 2021-06-28 DIAGNOSIS — N1339 Other hydronephrosis: Secondary | ICD-10-CM | POA: Diagnosis not present

## 2021-06-28 DIAGNOSIS — Z01818 Encounter for other preprocedural examination: Secondary | ICD-10-CM

## 2021-06-28 DIAGNOSIS — N132 Hydronephrosis with renal and ureteral calculous obstruction: Secondary | ICD-10-CM | POA: Diagnosis not present

## 2021-06-28 HISTORY — DX: Presence of spectacles and contact lenses: Z97.3

## 2021-06-28 HISTORY — DX: Personal history of urinary calculi: Z87.442

## 2021-06-28 HISTORY — PX: CYSTOSCOPY/URETEROSCOPY/HOLMIUM LASER/STENT PLACEMENT: SHX6546

## 2021-06-28 HISTORY — DX: Anemia, unspecified: D64.9

## 2021-06-28 LAB — POCT PREGNANCY, URINE: Preg Test, Ur: NEGATIVE

## 2021-06-28 SURGERY — CYSTOSCOPY/URETEROSCOPY/HOLMIUM LASER/STENT PLACEMENT
Anesthesia: General | Site: Ureter | Laterality: Left

## 2021-06-28 MED ORDER — PROMETHAZINE HCL 25 MG/ML IJ SOLN: 6.2500 mg | INTRAMUSCULAR | Status: DC | PRN

## 2021-06-28 MED ORDER — KETOROLAC TROMETHAMINE 30 MG/ML IJ SOLN
INTRAMUSCULAR | Status: DC | PRN
Start: 2021-06-28 — End: 2021-06-28
  Administered 2021-06-28: 30 mg via INTRAVENOUS

## 2021-06-28 MED ORDER — MEPERIDINE HCL 25 MG/ML IJ SOLN: 6.2500 mg | INTRAMUSCULAR | Status: DC | PRN

## 2021-06-28 MED ORDER — DEXAMETHASONE SODIUM PHOSPHATE 10 MG/ML IJ SOLN
INTRAMUSCULAR | Status: AC
  Filled 2021-06-28: qty 1

## 2021-06-28 MED ORDER — OXYCODONE HCL 5 MG PO TABS: 5.0000 mg | ORAL_TABLET | Freq: Once | ORAL | Status: DC | PRN

## 2021-06-28 MED ORDER — PROPOFOL 10 MG/ML IV BOLUS
INTRAVENOUS | Status: DC | PRN
  Administered 2021-06-28: 50 mg via INTRAVENOUS
  Administered 2021-06-28: 200 mg via INTRAVENOUS

## 2021-06-28 MED ORDER — ACETAMINOPHEN 500 MG PO TABS
1000.0000 mg | ORAL_TABLET | Freq: Once | ORAL | Status: AC
  Administered 2021-06-28: 1000 mg via ORAL

## 2021-06-28 MED ORDER — LIDOCAINE HCL (CARDIAC) PF 100 MG/5ML IV SOSY
PREFILLED_SYRINGE | INTRAVENOUS | Status: DC | PRN
  Administered 2021-06-28: 60 mg via INTRAVENOUS

## 2021-06-28 MED ORDER — LACTATED RINGERS IV SOLN: INTRAVENOUS | Status: DC

## 2021-06-28 MED ORDER — CEFAZOLIN SODIUM-DEXTROSE 2-4 GM/100ML-% IV SOLN
INTRAVENOUS | Status: AC
  Filled 2021-06-28: qty 100

## 2021-06-28 MED ORDER — ONDANSETRON HCL 4 MG/2ML IJ SOLN
INTRAMUSCULAR | Status: DC | PRN
  Administered 2021-06-28: 4 mg via INTRAVENOUS

## 2021-06-28 MED ORDER — HYDROCODONE-ACETAMINOPHEN 5-325 MG PO TABS: 1.0000 | ORAL_TABLET | ORAL | 0 refills | Status: DC | PRN

## 2021-06-28 MED ORDER — SCOPOLAMINE 1 MG/3DAYS TD PT72
1.0000 | MEDICATED_PATCH | TRANSDERMAL | Status: DC
  Administered 2021-06-28: 1.5 mg via TRANSDERMAL

## 2021-06-28 MED ORDER — MIDAZOLAM HCL 5 MG/5ML IJ SOLN
INTRAMUSCULAR | Status: DC | PRN
  Administered 2021-06-28: 2 mg via INTRAVENOUS

## 2021-06-28 MED ORDER — KETOROLAC TROMETHAMINE 30 MG/ML IJ SOLN: 30.0000 mg | Freq: Once | INTRAMUSCULAR | Status: DC | PRN

## 2021-06-28 MED ORDER — FENTANYL CITRATE (PF) 100 MCG/2ML IJ SOLN
INTRAMUSCULAR | Status: AC
  Filled 2021-06-28: qty 2

## 2021-06-28 MED ORDER — AMISULPRIDE (ANTIEMETIC) 5 MG/2ML IV SOLN: 10.0000 mg | Freq: Once | INTRAVENOUS | Status: DC | PRN

## 2021-06-28 MED ORDER — FENTANYL CITRATE (PF) 100 MCG/2ML IJ SOLN
INTRAMUSCULAR | Status: DC | PRN
  Administered 2021-06-28: 100 ug via INTRAVENOUS

## 2021-06-28 MED ORDER — HYDROMORPHONE HCL 1 MG/ML IJ SOLN: 0.2500 mg | INTRAMUSCULAR | Status: DC | PRN

## 2021-06-28 MED ORDER — CEFAZOLIN SODIUM-DEXTROSE 2-4 GM/100ML-% IV SOLN
2.0000 g | INTRAVENOUS | Status: AC
  Administered 2021-06-28: 2 g via INTRAVENOUS

## 2021-06-28 MED ORDER — SODIUM CHLORIDE 0.9 % IR SOLN
Status: DC | PRN
  Administered 2021-06-28: 3000 mL via INTRAVESICAL

## 2021-06-28 MED ORDER — DEXAMETHASONE SODIUM PHOSPHATE 10 MG/ML IJ SOLN
INTRAMUSCULAR | Status: DC | PRN
  Administered 2021-06-28: 10 mg via INTRAVENOUS

## 2021-06-28 MED ORDER — PHENYLEPHRINE HCL (PRESSORS) 10 MG/ML IV SOLN
INTRAVENOUS | Status: DC | PRN
  Administered 2021-06-28: 80 ug via INTRAVENOUS

## 2021-06-28 MED ORDER — IOHEXOL 300 MG/ML  SOLN
INTRAMUSCULAR | Status: DC | PRN
  Administered 2021-06-28: 20 mL

## 2021-06-28 MED ORDER — SCOPOLAMINE 1 MG/3DAYS TD PT72
MEDICATED_PATCH | TRANSDERMAL | Status: AC
  Filled 2021-06-28: qty 1

## 2021-06-28 MED ORDER — ACETAMINOPHEN 500 MG PO TABS
ORAL_TABLET | ORAL | Status: AC
  Filled 2021-06-28: qty 2

## 2021-06-28 MED ORDER — ONDANSETRON HCL 4 MG/2ML IJ SOLN
INTRAMUSCULAR | Status: AC
  Filled 2021-06-28: qty 2

## 2021-06-28 MED ORDER — MIDAZOLAM HCL 2 MG/2ML IJ SOLN
INTRAMUSCULAR | Status: AC
  Filled 2021-06-28: qty 2

## 2021-06-28 MED ORDER — OXYCODONE HCL 5 MG/5ML PO SOLN: 5.0000 mg | Freq: Once | ORAL | Status: DC | PRN

## 2021-06-28 MED ORDER — KETOROLAC TROMETHAMINE 30 MG/ML IJ SOLN
INTRAMUSCULAR | Status: AC
  Filled 2021-06-28: qty 1

## 2021-06-28 SURGICAL SUPPLY — 20 items
BAG DRAIN URO-CYSTO SKYTR STRL (DRAIN) ×2 IMPLANT
BAG DRN UROCATH (DRAIN) ×1
BASKET ZERO TIP NITINOL 2.4FR (BASKET) ×1 IMPLANT
BSKT STON RTRVL ZERO TP 2.4FR (BASKET) ×1
CATH INTERMIT  6FR 70CM (CATHETERS) ×1 IMPLANT
CLOTH BEACON ORANGE TIMEOUT ST (SAFETY) ×2 IMPLANT
FIBER LASER FLEXIVA 365 (UROLOGICAL SUPPLIES) IMPLANT
GLOVE SURG ENC MOIS LTX SZ7.5 (GLOVE) ×2 IMPLANT
GOWN STRL REUS W/TWL XL LVL3 (GOWN DISPOSABLE) IMPLANT
GUIDEWIRE STR DUAL SENSOR (WIRE) ×2 IMPLANT
IV NS IRRIG 3000ML ARTHROMATIC (IV SOLUTION) ×2 IMPLANT
KIT TURNOVER CYSTO (KITS) ×2 IMPLANT
MANIFOLD NEPTUNE II (INSTRUMENTS) ×2 IMPLANT
NS IRRIG 500ML POUR BTL (IV SOLUTION) ×2 IMPLANT
PACK CYSTO (CUSTOM PROCEDURE TRAY) ×2 IMPLANT
STENT URET 6FRX26 CONTOUR (STENTS) ×1 IMPLANT
TRACTIP FLEXIVA PULS ID 200XHI (Laser) IMPLANT
TRACTIP FLEXIVA PULSE ID 200 (Laser) ×2
TUBE CONNECTING 12X1/4 (SUCTIONS) IMPLANT
TUBING UROLOGY SET (TUBING) ×1 IMPLANT

## 2021-06-28 NOTE — Discharge Instructions (Addendum)
[facility] [phone] Post Ureteroscopy With or Without Stent Instructions  Definitions:  Ureter: The duct that transports urine from the kidney to the bladder. Stent:   A plastic hollow tube that is placed into the ureter, from the kidney to the                 bladder to prevent the ureter from swelling shut.  GENERAL INSTRUCTIONS:  Despite the fact that no skin incisions were used, the area around the ureter and bladder is raw and irritated. The stent is a foreign body which will further irritate the bladder wall. This irritation is manifested by increased frequency of urination, both day and night, and by an increase in the urge to urinate. In some, the urge to urinate is present almost always. Sometimes the urge is strong enough that you may not be able to stop yourself from urinating. The only real cure is to remove the stent and then give time for the bladder wall to heal which can't be done until the danger of the ureter swelling shut has passed, which varies.  You may see some blood in your urine while the stent is in place and a few days afterwards. Do not be alarmed, even if the urine was clear for a while. Get off your feet and drink lots of fluids until clearing occurs. If you start to pass clots or don't improve, call us.  DIET: You may return to your normal diet immediately. Because of the raw surface of your bladder, alcohol, spicy foods, acid type foods and drinks with caffeine may cause irritation or frequency and should be used in moderation. To keep your urine flowing freely and to avoid constipation, drink plenty of fluids during the day ( 8-10 glasses ). Tip: Avoid cranberry juice because it is very acidic.  ACTIVITY: Your physical activity doesn't need to be restricted. However, if you are very active, you may see some blood in your urine. We suggest that you reduce your activity under these circumstances until the bleeding has stopped.  BOWELS: It is  important to keep your bowels regular during the postoperative period. Straining with bowel movements can cause bleeding. A bowel movement every other day is reasonable. Use a mild laxative if needed, such as Milk of Magnesia 2-3 tablespoons, or 2 Dulcolax tablets. Call if you continue to have problems. If you have been taking narcotics for pain, before, during or after your surgery, you may be constipated. Take a laxative if necessary.   MEDICATION: You should resume your pre-surgery medications unless told not to. You may take oxybutynin or flomax if prescribed for bladder spasms or discomfort from the stent Take pain medication as directed for pain refractory to conservative management  PROBLEMS YOU SHOULD REPORT TO [place]: Fevers over 100.5 Fahrenheit. Heavy bleeding, or clots ( See above notes about blood in urine ). Inability to urinate. Drug reactions ( hives, rash, nausea, vomiting, diarrhea ). Severe burning or pain with urination that is not improving.   Post Anesthesia Home Care Instructions  Activity: Get plenty of rest for the remainder of the day. A responsible individual must stay with you for 24 hours following the procedure.  For the next 24 hours, DO NOT: -Drive a car -Advertising copywriter -Drink alcoholic beverages -Take any medication unless instructed by your physician -Make any legal decisions or sign important papers.  Meals: Start with liquid foods such as gelatin or soup. Progress to regular foods as tolerated. Avoid greasy, spicy,  heavy foods. If nausea and/or vomiting occur, drink only clear liquids until the nausea and/or vomiting subsides. Call your physician if vomiting continues.  Special Instructions/Symptoms: Your throat may feel dry or sore from the anesthesia or the breathing tube placed in your throat during surgery. If this causes discomfort, gargle with warm salt water. The discomfort should disappear within 24 hours.  No acetaminophen/Tylenol until  after 6:15 pm today if needed. No ibuprofen, Advil, Aleve, Motrin, ketorolac, meloxicam, naproxen, or other NSAIDS until after 8:00 pm today if needed.

## 2021-06-28 NOTE — Anesthesia Procedure Notes (Signed)
Procedure Name: LMA Insertion Date/Time: 06/28/2021 1:24 PM Performed by: Shanon Payor, CRNA Pre-anesthesia Checklist: Patient identified, Emergency Drugs available, Suction available, Patient being monitored and Timeout performed Patient Re-evaluated:Patient Re-evaluated prior to induction Oxygen Delivery Method: Circle system utilized Preoxygenation: Pre-oxygenation with 100% oxygen Induction Type: IV induction LMA: LMA inserted LMA Size: 4.0 Number of attempts: 1 Placement Confirmation: positive ETCO2 and breath sounds checked- equal and bilateral Tube secured with: Tape Dental Injury: Teeth and Oropharynx as per pre-operative assessment

## 2021-06-28 NOTE — Anesthesia Postprocedure Evaluation (Signed)
Anesthesia Post Note  Patient: Gabrielle Zamora  Procedure(s) Performed: CYSTOSCOPY LEFT URETEROSCOPY/HOLMIUM LASER/STENT PLACEMENT (Left: Ureter)     Patient location during evaluation: PACU Anesthesia Type: General Level of consciousness: awake and alert, oriented and patient cooperative Pain management: pain level controlled Vital Signs Assessment: post-procedure vital signs reviewed and stable Respiratory status: spontaneous breathing, nonlabored ventilation and respiratory function stable Cardiovascular status: blood pressure returned to baseline and stable Postop Assessment: no apparent nausea or vomiting Anesthetic complications: no   No notable events documented.  Last Vitals:  Vitals:   06/28/21 1430 06/28/21 1445  BP: 136/79   Pulse: 63   Resp: 10   Temp:  36.5 C  SpO2: 98%     Last Pain:  Vitals:   06/28/21 1445  TempSrc:   PainSc: 0-No pain                 Pervis Hocking

## 2021-06-28 NOTE — Transfer of Care (Signed)
Immediate Anesthesia Transfer of Care Note  Patient: Gabrielle Zamora  Procedure(s) Performed: CYSTOSCOPY LEFT URETEROSCOPY/HOLMIUM LASER/STENT PLACEMENT (Left: Ureter)  Patient Location: PACU  Anesthesia Type:General  Level of Consciousness: sedated  Airway & Oxygen Therapy: Patient Spontanous Breathing and Patient connected to nasal cannula oxygen  Post-op Assessment: Report given to RN and Post -op Vital signs reviewed and stable  Post vital signs: Reviewed and stable  Last Vitals:  Vitals Value Taken Time  BP 132/81 06/28/21 1418  Temp    Pulse 72 06/28/21 1422  Resp 9 06/28/21 1422  SpO2 97 % 06/28/21 1422  Vitals shown include unvalidated device data.  Last Pain:  Vitals:   06/28/21 1207  TempSrc:   PainSc: 0-No pain      Patients Stated Pain Goal: 6 (06/28/21 1207)  Complications: No notable events documented.

## 2021-06-28 NOTE — H&P (Signed)
CC/HPI: CC: Left ureteral calculus  HPI:  06/20/2021  46 year old female was having pelvic pain and pressure that was intermittent. She had a CT scan of the abdomen and pelvis without contrast on 06/15/2021 that revealed an obstructing 7 mm calculus. Looks like there were 2 adjacent calculi in the distal ureter. They are present on KUB today. She continues to have some intermittent discomfort. Pain is not severe. Denies any fever, chill, nausea, vomiting. No known stones in the past but has had episodes of pain in the past that were diagnosed as a UTI.     ALLERGIES: No Known Drug Allergies    MEDICATIONS: Ibuprofen  Tylenol     GU PSH: None   NON-GU PSH: Cesarean Delivery Remove Adenoids     GU PMH: None   NON-GU PMH: None   FAMILY HISTORY: 2 daughters - Other 1 son - Other suicide - Father   SOCIAL HISTORY: Marital Status: Married Preferred Language: English; Race: White Current Smoking Status: Patient does not smoke anymore. Has not smoked since 06/03/2003.   Tobacco Use Assessment Completed: Used Tobacco in last 30 days? Drinks 3 caffeinated drinks per day.    REVIEW OF SYSTEMS:    GU Review Female:   Patient reports frequent urination, get up at night to urinate, and have to strain to urinate. Patient denies hard to postpone urination, burning /pain with urination, leakage of urine, stream starts and stops, trouble starting your stream, and being pregnant.  Gastrointestinal (Upper):   Patient reports nausea, vomiting, and indigestion/ heartburn.   Gastrointestinal (Lower):   Patient denies diarrhea and constipation.  Constitutional:   Patient reports night sweats and fatigue. Patient denies fever and weight loss.  Skin:   Patient denies skin rash/ lesion and itching.  Eyes:   Patient denies blurred vision and double vision.  Ears/ Nose/ Throat:   Patient denies sore throat and sinus problems.  Hematologic/Lymphatic:   Patient reports swollen glands. Patient denies easy  bruising.  Cardiovascular:   Patient denies leg swelling and chest pains.  Respiratory:   Patient reports cough and shortness of breath.   Endocrine:   Patient denies excessive thirst.  Musculoskeletal:   Patient reports back pain. Patient denies joint pain.  Neurological:   Patient denies headaches and dizziness.  Psychologic:   Patient denies depression and anxiety.   Notes: hematuria  UTI      VITAL SIGNS:      06/20/2021 09:44 AM  Weight 164 lb / 74.39 kg  Height 69 in / 175.26 cm  BP 136/85 mmHg  Heart Rate 81 /min  Temperature 97.8 F / 36.5 C  BMI 24.2 kg/m   MULTI-SYSTEM PHYSICAL EXAMINATION:    Constitutional: Well-nourished. No physical deformities. Normally developed. Good grooming.  Respiratory: No labored breathing, no use of accessory muscles.   Cardiovascular: Normal temperature, normal extremity pulses, no swelling, no varicosities.  Skin: No paleness, no jaundice, no cyanosis. No lesion, no ulcer, no rash.  Neurologic / Psychiatric: Oriented to time, oriented to place, oriented to person. No depression, no anxiety, no agitation.  Gastrointestinal: No mass, no tenderness, no rigidity, non obese abdomen.  Eyes: Normal conjunctivae. Normal eyelids.  Musculoskeletal: Normal gait and station of head and neck.     Complexity of Data:  Source Of History:  Patient  Records Review:   Previous Doctor Records, Previous Patient Records  Urine Test Review:   Urinalysis  X-Ray Review: C.T. Abdomen/Pelvis: Reviewed Films. Reviewed Report. Discussed With Patient.  PROCEDURES:         KUB - [id number]  A single view of the abdomen is obtained.  Distal left ureteral calculus again visualized on KUB.      Patient confirmed No Neulasta OnPro Device.           Urinalysis w/Scope Dipstick Dipstick Cont'd Micro  Color: Yellow Bilirubin: Neg mg/dL WBC/hpf: 6 - 10/hpf  Appearance: Clear Ketones: Neg mg/dL RBC/hpf: NS (Not Seen)  Specific Gravity: 1.015 Blood: Neg ery/uL  Bacteria: Few (10-25/hpf)  pH: 6.0 Protein: Trace mg/dL Cystals: NS (Not Seen)  Glucose: Neg mg/dL Urobilinogen: 0.2 mg/dL Casts: NS (Not Seen)    Nitrites: Neg Trichomonas: Not Present    Leukocyte Esterase: 2+ leu/uL Mucous: Not Present      Epithelial Cells: 0 - 5/hpf      Yeast: NS (Not Seen)      Sperm: Not Present    ASSESSMENT:      ICD-10 Details  1 GU:   Ureteral calculus - N20.1 Undiagnosed New Problem  2   Ureteral obstruction secondary to calculous - N13.2 Undiagnosed New Problem   PLAN:            Medications New Meds: Hydrocodone-Acetaminophen 5 mg-325 mg tablet 1 tablet PO Q 6 H PRN pain  #10  0 Refill(s)  Pharmacy Name:  [id number]  Address:  [address] [place], [place] [address]  Phone:  [phone]  Fax:  [phone]            Orders Labs Urine Culture  X-Rays: KUB          Schedule         Document Letter(s):  Created for Patient: Clinical Summary         Notes:   We discussed the management of urinary stones. These options include observation, ureteroscopy, and shockwave lithotripsy. We discussed which options are relevant to these particular stones. We discussed the natural history of stones as well as the complications of untreated stones and the impact on quality of life without treatment as well as with each of the above listed treatments. We also discussed the efficacy of each treatment in its ability to clear the stone burden. With any of these management options I discussed the signs and symptoms of infection and the need for emergent treatment should these be experienced. For each option we discussed the ability of each procedure to clear the patient of their stone burden.   For observation I described the risks which include but are not limited to silent renal damage, life-threatening infection, need for emergent surgery, failure to pass stone, and pain.   For ureteroscopy I described the risks which include heart attack,  stroke, pulmonary embolus, death, bleeding, infection, damage to contiguous structures, positioning injury, ureteral stricture, ureteral avulsion, ureteral injury, need for ureteral stent, inability to perform ureteroscopy, need for an interval procedure, inability to clear stone burden, stent discomfort and pain.   For shockwave lithotripsy I described the risks which include arrhythmia, kidney contusion, kidney hemorrhage, need for transfusion, pain, inability to break up stone, inability to pass stone fragments, Steinstrasse, infection associated with obstructing stones, need for different surgical procedure, need for repeat shockwave lithotripsy.   She would like to proceed with ureteroscopy. She has a prescription for Flomax. She will start this and I gave her a strainer. She does not have any medication for severe pain and requested this.   Dr. Cherlynn Kaiser    Signed by  Link Snuffer, III, M.D. on 06/20/21 at 10:42 AM (EST

## 2021-06-28 NOTE — Anesthesia Preprocedure Evaluation (Addendum)
Anesthesia Evaluation  Patient identified by MRN, date of birth, ID band Patient awake    Reviewed: Allergy & Precautions, NPO status , Patient's Chart, lab work & pertinent test results  Airway Mallampati: I  TM Distance: >3 FB Neck ROM: Full    Dental no notable dental hx. (+) Teeth Intact, Dental Advisory Given   Pulmonary former smoker,    Pulmonary exam normal breath sounds clear to auscultation       Cardiovascular negative cardio ROS Normal cardiovascular exam Rhythm:Regular Rate:Normal     Neuro/Psych negative neurological ROS  negative psych ROS   GI/Hepatic negative GI ROS, Neg liver ROS,   Endo/Other  negative endocrine ROS  Renal/GU Renal disease (L ureteral stone)  negative genitourinary   Musculoskeletal negative musculoskeletal ROS (+)   Abdominal   Peds  Hematology negative hematology ROS (+)   Anesthesia Other Findings   Reproductive/Obstetrics negative OB ROS                            Anesthesia Physical Anesthesia Plan  ASA: 1  Anesthesia Plan: General   Post-op Pain Management: Tylenol PO (pre-op) and Toradol IV (intra-op)   Induction: Intravenous  PONV Risk Score and Plan: 4 or greater and Ondansetron, Dexamethasone, Midazolam and Treatment may vary due to age or medical condition  Airway Management Planned: LMA  Additional Equipment: None  Intra-op Plan:   Post-operative Plan: Extubation in OR  Informed Consent: I have reviewed the patients History and Physical, chart, labs and discussed the procedure including the risks, benefits and alternatives for the proposed anesthesia with the patient or authorized representative who has indicated his/her understanding and acceptance.     Dental advisory given  Plan Discussed with: CRNA  Anesthesia Plan Comments:        Anesthesia Quick Evaluation

## 2021-06-28 NOTE — Op Note (Addendum)
Operative Note  Preoperative diagnosis:  1.  Left ureteral calculus  Postoperative diagnosis: 1.  Left ureteral calculus  Procedure(s): 1.  Cystoscopy with left retrograde pyelogram, left ureteroscopy with laser lithotripsy and stone basketing, ureteral stent placement  Surgeon: Modena Slater, MD  Assistants: None  Anesthesia: General  Complications: None immediate  EBL: Minimal  Specimens: 1.  Renal calculus  Drains/Catheters: 1.  6 x 26 double-J ureteral stent  Intraoperative findings: Normal urethra and bladder. 2.  7 mm distal impacted ureteral calculus fragmented and basket extracted.  There was significant periureteral inflammation around the level of the stone.  Retrograde pyelogram revealed severe left-sided hydroureteronephrosis.  Indication: 46 year old female with a left ureteral calculus presents for the previously mentioned operation.  Description of procedure:  The patient was identified and consent was obtained.  The patient was taken to the operating room and placed in the supine position.  The patient was placed under general anesthesia.  Perioperative antibiotics were administered.  The patient was placed in dorsal lithotomy.  Patient was prepped and draped in a standard sterile fashion and a timeout was performed.  A 21 French rigid cystoscope was advanced into the urethra and into the bladder.  Complete cystoscopy was performed with no abnormal findings.  The left ureter was cannulated with a sensor wire which was advanced up to the kidney under fluoroscopic guidance.  Semirigid ureteroscopy was performed.  The stone was encountered.  It was too large to be basket extracted and therefore I laser fragmented to smaller fragments followed by basket extraction of the smaller fragments.  The stone was significantly impacted and there was a lot of periureteral inflammation at the level of stone impaction.  I inspected the remainder of the ureter and there were no  clinically significant stone fragments.  She had some significant tortuosity at the proximal ureter and severe hydronephrosis.  I was able to navigate the wire past the area of tortuosity and into the renal pelvis.  I then backloaded wire onto rigid cystoscope advanced into the bladder followed by placement of a 6 x 26 double-J ureteral stent.  Fluoroscopy showed a curl in the proximal left ureter/renal pelvis.  There was a good curl in the bladder.  I drained the bladder and withdrew the scope.  Patient tolerated the procedure well was stable postoperative.  Plan: Follow-up in 1 week for stent removal

## 2021-07-01 ENCOUNTER — Encounter (HOSPITAL_BASED_OUTPATIENT_CLINIC_OR_DEPARTMENT_OTHER): Payer: Self-pay | Admitting: Urology

## 2021-07-05 DIAGNOSIS — R8271 Bacteriuria: Secondary | ICD-10-CM | POA: Diagnosis not present

## 2021-07-05 DIAGNOSIS — N201 Calculus of ureter: Secondary | ICD-10-CM | POA: Diagnosis not present

## 2021-07-06 ENCOUNTER — Other Ambulatory Visit: Payer: Self-pay | Admitting: Family Medicine

## 2021-07-08 ENCOUNTER — Other Ambulatory Visit: Payer: Self-pay | Admitting: *Deleted

## 2021-07-08 MED ORDER — VITAMIN D (ERGOCALCIFEROL) 1.25 MG (50000 UNIT) PO CAPS: 50000.0000 [IU] | ORAL_CAPSULE | ORAL | 0 refills | Status: DC

## 2021-07-08 NOTE — Telephone Encounter (Signed)
Patient stated that she has seen urology, she stated that she has had surgery. Patient stated she does not need Flomax at this time.

## 2021-08-03 IMAGING — CT CT CARDIAC CORONARY ARTERY CALCIUM SCORE
3 series · 14 of 20 positions shown, 15 images · non-contrast
Comparison: None.
COMPARISON: None.

Addendum:
EXAM:
OVER-READ INTERPRETATION  CT CHEST

The following report is an over-read performed by radiologist Dr.
Soterios Asenovi [REDACTED] on 09/29/2019. This
over-read does not include interpretation of cardiac or coronary
anatomy or pathology. The coronary calcium score interpretation by
the cardiologist is attached.
CLINICAL DATA: Risk stratification
Coronary Calcium Score
TECHNIQUE: The patient was scanned on a Siemens Force scanner. Axial
non-contrast 3 mm slices were carried out through the heart. The
data set was analyzed on a dedicated work station and scored using
the Agatson method.

[Series 2: casc 3.0 bv41 2 bestdiast 70 % · axial · 0.30mm/px · z∈[-250,-169]mm · 4 of 47 slices shown, 5 images]
[im 10/47  vessel]
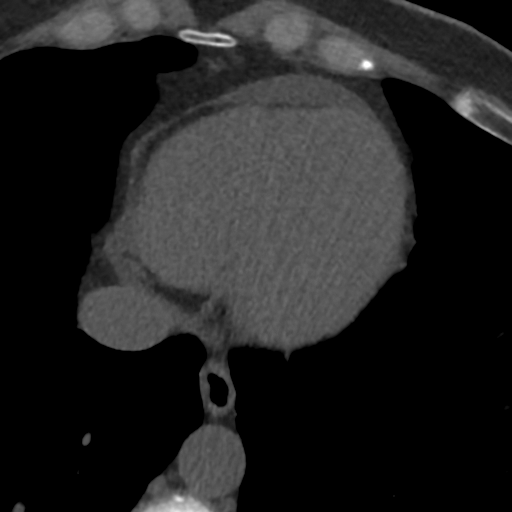
[im 10/47  lung]
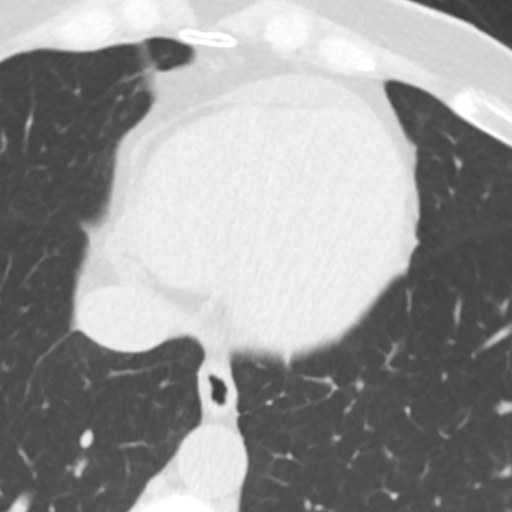
[im 19/47  vessel]
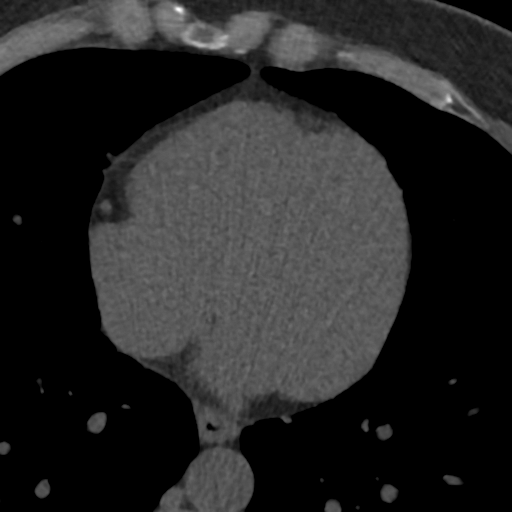
[im 28/47  vessel]
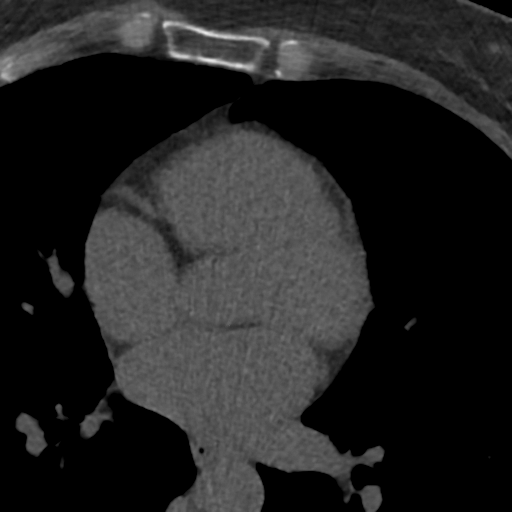
[im 37/47  vessel]
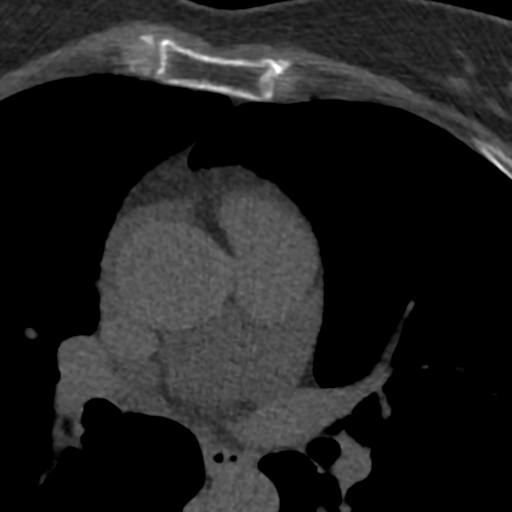

[Series 3: lung 71 % · axial · 0.63mm/px · z∈[-258,-162]mm · 5 of 48 slices shown]
[im 8/48  lung]
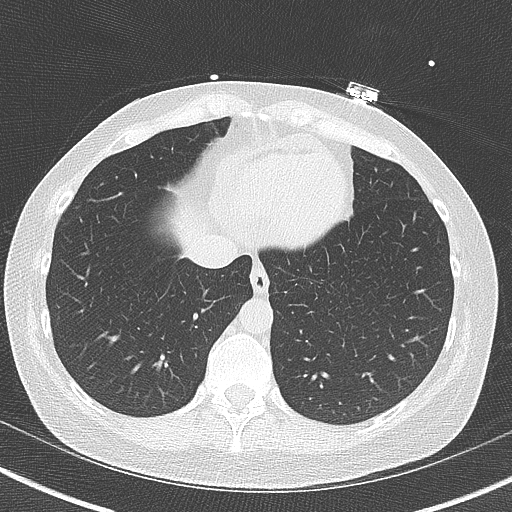
[im 16/48  lung]
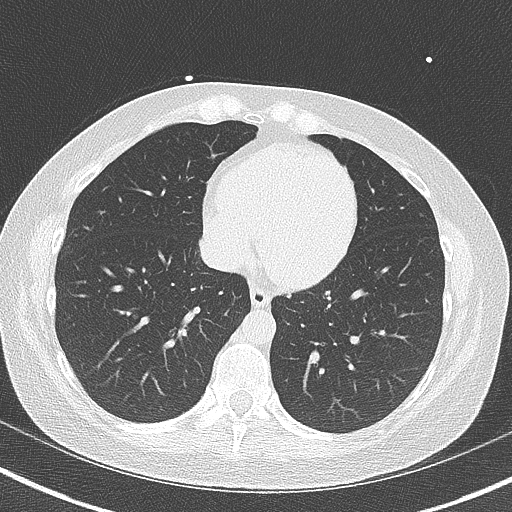
[im 24/48  lung]
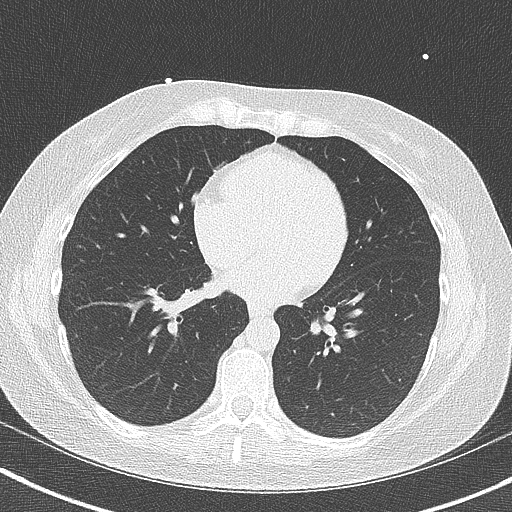
[im 32/48  lung]
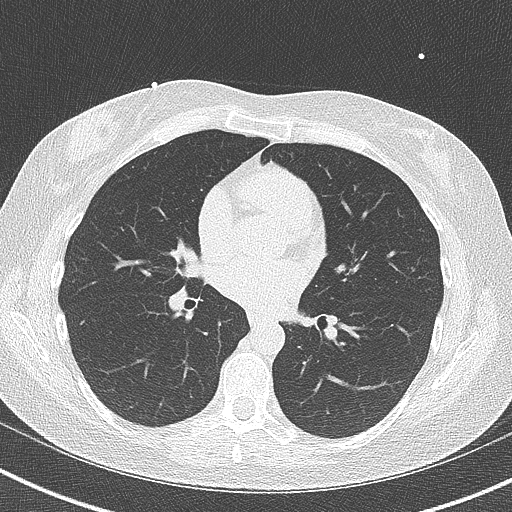
[im 40/48  lung]
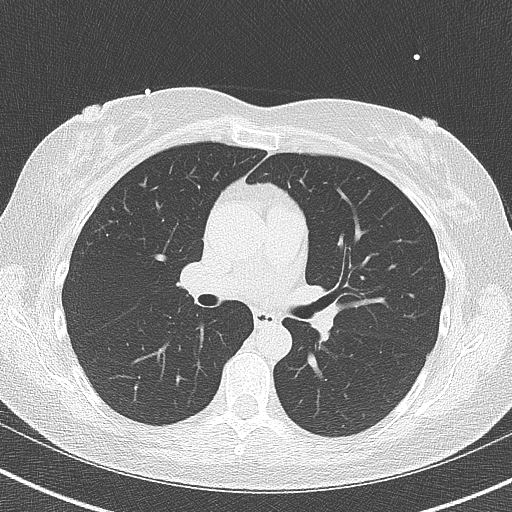

[Series 4: lung st 71 % · axial · 0.63mm/px · z∈[-258,-162]mm · 5 of 48 slices shown]
[im 8/48  lung]
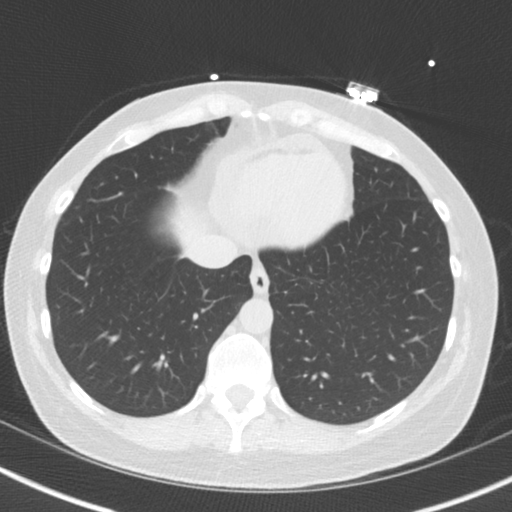
[im 16/48  lung]
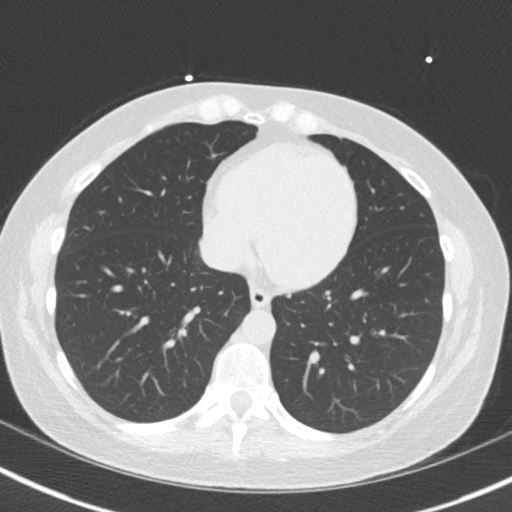
[im 24/48  lung]
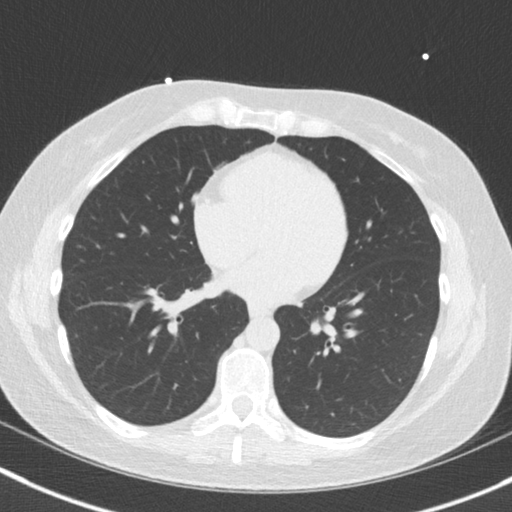
[im 32/48  lung]
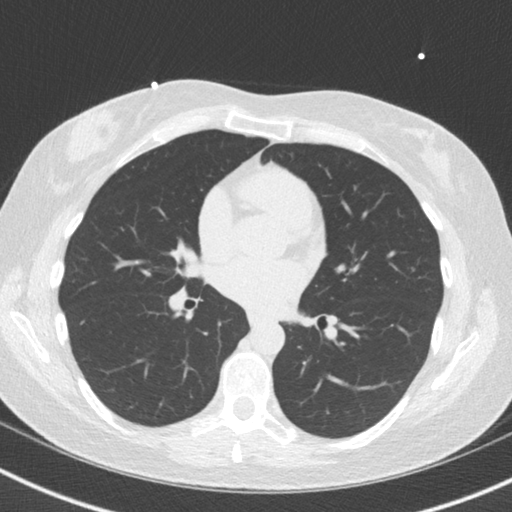
[im 40/48  lung]
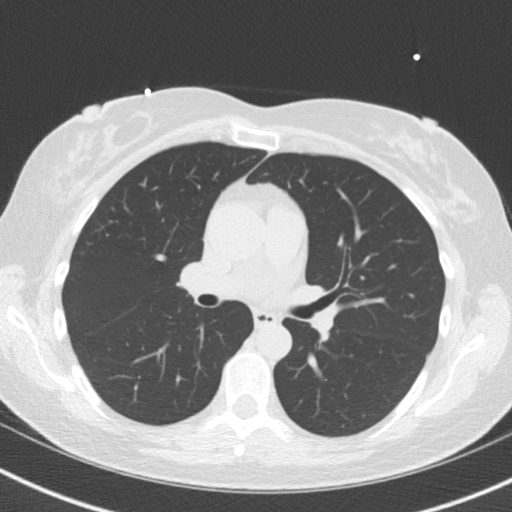

[14 of 20 positions shown; findings below may reference images not displayed]

FINDINGS: Within the visualized portions of the thorax there are no suspicious
appearing pulmonary nodules or masses, there is no acute
consolidative airspace disease, no pleural effusions, no
pneumothorax and no lymphadenopathy. Visualized portions of the
upper abdomen are unremarkable. There are no aggressive appearing
lytic or blastic lesions noted in the visualized portions of the
skeleton.
IMPRESSION: No significant incidental noncardiac findings are noted.
FINDINGS: Non-cardiac: See separate report from [REDACTED].

Ascending Aorta: Normal caliber.

Pericardium: Normal

Coronary arteries: Normal origins.
IMPRESSION: Coronary calcium score of 0.

*** End of Addendum ***
EXAM:
OVER-READ INTERPRETATION  CT CHEST

The following report is an over-read performed by radiologist Dr.
Soterios Asenovi [REDACTED] on 09/29/2019. This
over-read does not include interpretation of cardiac or coronary
anatomy or pathology. The coronary calcium score interpretation by
the cardiologist is attached.
FINDINGS: Within the visualized portions of the thorax there are no suspicious
appearing pulmonary nodules or masses, there is no acute
consolidative airspace disease, no pleural effusions, no
pneumothorax and no lymphadenopathy. Visualized portions of the
upper abdomen are unremarkable. There are no aggressive appearing
lytic or blastic lesions noted in the visualized portions of the
skeleton.
IMPRESSION: No significant incidental noncardiac findings are noted.

## 2021-08-08 DIAGNOSIS — N201 Calculus of ureter: Secondary | ICD-10-CM | POA: Diagnosis not present

## 2021-08-08 DIAGNOSIS — N132 Hydronephrosis with renal and ureteral calculous obstruction: Secondary | ICD-10-CM | POA: Diagnosis not present

## 2021-08-15 ENCOUNTER — Encounter: Payer: Self-pay | Admitting: Physician Assistant

## 2021-08-15 ENCOUNTER — Telehealth (INDEPENDENT_AMBULATORY_CARE_PROVIDER_SITE_OTHER): Payer: BC Managed Care – PPO | Admitting: Physician Assistant

## 2021-08-15 VITALS — Temp 99.8°F | Ht 69.5 in | Wt 170.0 lb

## 2021-08-15 DIAGNOSIS — U071 COVID-19: Secondary | ICD-10-CM

## 2021-08-15 MED ORDER — MOLNUPIRAVIR EUA 200MG CAPSULE: 4.0000 | ORAL_CAPSULE | Freq: Two times a day (BID) | ORAL | 0 refills | Status: AC

## 2021-08-15 NOTE — Patient Instructions (Signed)
Please start on the Molnupiravir as directed.  Follow-up through MyChart with a message for me tomorrow.  Low threshold for ER if acutely worsening symptoms today. ?

## 2021-08-15 NOTE — Progress Notes (Signed)
? ?  Virtual Visit via Video Note ? ?I connected with  Gabrielle Zamora  on 08/15/21 at 11:45 AM EDT by a video enabled telemedicine application and verified that I am speaking with the correct person using two identifiers. ? ?Location: ?Patient: home ?Provider: Nature conservation officer at [employer] ?Persons present: Patient and myself ?  ?I discussed the limitations of evaluation and management by telemedicine and the availability of in person appointments. The patient expressed understanding and agreed to proceed.  ? ?History of Present Illness: ? ?Chief complaint: COVID-19 positive today ?Symptom onset: 2 days ago ?Pertinent positives: Tired, headache, fever, body aches, some chest congestion / pain ?Pertinent negatives: n/v/d/ abd pain, dizziness ?Treatments tried: Tylenol / Motrin ?Vaccine status: Not UTD on COVID or flu ?Sick exposure: Twins may have had sickness over the weekend ? ? ?Observations/Objective: ? ?See vitals reported ? ?Gen: Awake, alert, no acute distress; general malaise ?Resp: Breathing is even and non-labored ?Psych: calm/pleasant demeanor ?Neuro: Alert and Oriented x 3, + facial symmetry, speech is clear. ? ? ?Assessment and Plan: ? ?1. COVID-19 ?Diagnosis confirmed via home antigen test.  We discussed current algorithm recommendations for prescribing outpatient antivirals.  As the patient is not up-to-date on vaccinations and she was within 5-day window onset of symptoms, Molnupiravir at this time.  Risks versus benefits discussed.   ? ?Advised self-isolation at home for the next 5 days and then masking around others for at least an additional 5 days.  Treat supportively at this time including sleeping prone, deep breathing exercises, pushing fluids, walking every few hours, vitamins C and D, and Tylenol or ibuprofen as needed.  The patient understands that COVID-19 illness can wax and wane.  Should the symptoms acutely worsen or patient starts to experience sudden shortness of breath,  chest pain, severe weakness, the patient will go straight to the emergency department.  Also advised home pulse oximetry monitoring and for any reading consistently under 92%, should also report to the emergency department.  The patient will continue to keep [place] updated. ? ? ?Follow Up Instructions: ? ?  ?I discussed the assessment and treatment plan with the patient. The patient was provided an opportunity to ask questions and all were answered. The patient agreed with the plan and demonstrated an understanding of the instructions. ?  ?The patient was advised to call back or seek an in-person evaluation if the symptoms worsen or if the condition fails to improve as anticipated. ? ?Zeniah Briney M Zarea Diesing, PA-C ? ?

## 2021-08-16 ENCOUNTER — Encounter: Payer: Self-pay | Admitting: Physician Assistant

## 2022-01-09 ENCOUNTER — Encounter: Payer: Self-pay | Admitting: Family

## 2022-01-09 ENCOUNTER — Telehealth (INDEPENDENT_AMBULATORY_CARE_PROVIDER_SITE_OTHER): Payer: BC Managed Care – PPO | Admitting: Family

## 2022-01-09 VITALS — Ht 69.5 in | Wt 175.0 lb

## 2022-01-09 DIAGNOSIS — J0110 Acute frontal sinusitis, unspecified: Secondary | ICD-10-CM

## 2022-01-09 DIAGNOSIS — E559 Vitamin D deficiency, unspecified: Secondary | ICD-10-CM

## 2022-01-09 MED ORDER — VITAMIN D (ERGOCALCIFEROL) 1.25 MG (50000 UNIT) PO CAPS: 50000.0000 [IU] | ORAL_CAPSULE | ORAL | 0 refills | Status: DC

## 2022-01-09 MED ORDER — AMOXICILLIN-POT CLAVULANATE 875-125 MG PO TABS: 1.0000 | ORAL_TABLET | Freq: Two times a day (BID) | ORAL | 0 refills | Status: DC

## 2022-01-09 NOTE — Progress Notes (Signed)
MyChart Video Visit    Virtual Visit via Video Note   This format is felt to be most appropriate for this patient at this time. Physical exam was limited by quality of the video and audio technology used for the visit. CMA was able to get the patient set up on a video visit.  Patient location: Home. Patient and provider in visit Provider location: Office  I discussed the limitations of evaluation and management by telemedicine and the availability of in person appointments. The patient expressed understanding and agreed to proceed.  Visit Date: 01/09/2022  Today's healthcare provider: Dulce Sellar, NP     Subjective:   Patient ID: Gabrielle Zamora, female    DOB: 07-Dec-1975, 46 y.o.   MRN: [id number]  Chief Complaint  Patient presents with   Fatigue    Pt c/o fatigue, sore throat, body aches, sinus drainage(yellow). Covid tested negative on Monday 8/7. Has tried benadryl, sudafed, Tylenol which did help the headache a little.     HPI Sinus sx:  started about 2 weeks ago, tired, headache, body aches, sinus drainage/congestion, sinus pain & pressure. Has taken Sudafed and Tylenol with some relief. Denies fever or cough.  Assessment & Plan:   Problem List Items Addressed This Visit   None Visit Diagnoses     Acute non-recurrent frontal sinusitis    -  Primary sending Augmentin, advised on use & SE, continue generic Sudafed and Ibuprofen or Tylenol for headaches, pain. Drink at least 2L water qd, add saline nasal spray tid to help flush sinuses, prevent future infections.    Relevant Medications   amoxicillin-clavulanate (AUGMENTIN) 875-125 MG tablet   Vitamin D deficiency       Relevant Medications   Vitamin D, Ergocalciferol, (DRISDOL) 1.25 MG (50000 UNIT) CAPS capsule       Past Medical History:  Diagnosis Date   Allergy    Anemia    History of COVID-19 07/2020   fever/fatigue x 10 days all symptoms resolved   History of recurrent UTIs    kidney stone     Pneumonia 05/31/2021   all symptoms resolved by 06-05-2021 per pt   Wears glasses for reading     Past Surgical History:  Procedure Laterality Date   ADENOIDECTOMY  1985   CESAREAN SECTION  2009, 2012   CYSTOSCOPY/URETEROSCOPY/HOLMIUM LASER/STENT PLACEMENT Left 06/28/2021   Procedure: CYSTOSCOPY LEFT URETEROSCOPY/HOLMIUM LASER/STENT PLACEMENT;  Surgeon: Crista Elliot, MD;  Location: [facility];  Service: Urology;  Laterality: Left;    Outpatient Medications Prior to Visit  Medication Sig Dispense Refill   esomeprazole (NEXIUM) 20 MG capsule Take 20 mg by mouth daily as needed.     levonorgestrel (MIRENA) 20 MCG/24HR IUD Mirena 20 mcg/24 hours (5 yrs) 52 mg intrauterine device  Take 1 device by intrauterine route.     Vitamin D, Ergocalciferol, (DRISDOL) 1.25 MG (50000 UNIT) CAPS capsule Take 1 capsule (50,000 Units total) by mouth every 7 (seven) days. 12 capsule 0   No facility-administered medications prior to visit.    No Known Allergies     Objective:   Physical Exam Vitals and nursing note reviewed.  Constitutional:      General: She is not in acute distress.    Appearance: Normal appearance.  HENT:     Head: Normocephalic.  Pulmonary:     Effort: No respiratory distress.  Musculoskeletal:     Cervical back: Normal range of motion.  Skin:    General: Skin  is dry.     Coloration: Skin is not pale.  Neurological:     Mental Status: She is alert and oriented to person, place, and time.  Psychiatric:        Mood and Affect: Mood normal.   Ht 5' 9.5" (1.765 m)   Wt 175 lb (79.4 kg)   BMI 25.47 kg/m   Wt Readings from Last 3 Encounters:  01/09/22 175 lb (79.4 kg)  08/15/21 170 lb (77.1 kg)  06/28/21 169 lb 8 oz (76.9 kg)      I discussed the assessment and treatment plan with the patient. The patient was provided an opportunity to ask questions and all were answered. The patient agreed with the plan and demonstrated an understanding of the  instructions.   The patient was advised to call back or seek an in-person evaluation if the symptoms worsen or if the condition fails to improve as anticipated.  Dulce Sellar, NP [facility] [phone] (phone) [phone] (fax)  [facility]

## 2022-02-24 ENCOUNTER — Encounter: Payer: Self-pay | Admitting: *Deleted

## 2022-05-15 ENCOUNTER — Encounter: Payer: Self-pay | Admitting: *Deleted

## 2022-05-16 DIAGNOSIS — S62647A Nondisplaced fracture of proximal phalanx of left little finger, initial encounter for closed fracture: Secondary | ICD-10-CM | POA: Diagnosis not present

## 2022-05-20 DIAGNOSIS — S62617A Displaced fracture of proximal phalanx of left little finger, initial encounter for closed fracture: Secondary | ICD-10-CM | POA: Diagnosis not present

## 2022-05-20 DIAGNOSIS — S62617D Displaced fracture of proximal phalanx of left little finger, subsequent encounter for fracture with routine healing: Secondary | ICD-10-CM | POA: Diagnosis not present

## 2022-05-20 DIAGNOSIS — M79645 Pain in left finger(s): Secondary | ICD-10-CM | POA: Diagnosis not present

## 2022-06-03 DIAGNOSIS — S62617A Displaced fracture of proximal phalanx of left little finger, initial encounter for closed fracture: Secondary | ICD-10-CM | POA: Diagnosis not present

## 2022-06-03 DIAGNOSIS — S62617D Displaced fracture of proximal phalanx of left little finger, subsequent encounter for fracture with routine healing: Secondary | ICD-10-CM | POA: Diagnosis not present

## 2022-08-25 ENCOUNTER — Telehealth: Payer: Self-pay | Admitting: Physician Assistant

## 2022-08-25 NOTE — Telephone Encounter (Signed)
Pt would like to switch from Alyssa to Dr Cherlynn Kaiser because she had an appt with her a while back and really liked for her to be her PCP. Please advise.

## 2022-09-03 ENCOUNTER — Ambulatory Visit (INDEPENDENT_AMBULATORY_CARE_PROVIDER_SITE_OTHER): Payer: BC Managed Care – PPO | Admitting: Family Medicine

## 2022-09-03 ENCOUNTER — Encounter: Payer: Self-pay | Admitting: Family Medicine

## 2022-09-03 VITALS — BP 122/84 | HR 89 | Temp 98.1°F | Ht 66.0 in | Wt 174.0 lb

## 2022-09-03 DIAGNOSIS — R002 Palpitations: Secondary | ICD-10-CM | POA: Diagnosis not present

## 2022-09-03 DIAGNOSIS — Z0000 Encounter for general adult medical examination without abnormal findings: Secondary | ICD-10-CM | POA: Diagnosis not present

## 2022-09-03 DIAGNOSIS — E559 Vitamin D deficiency, unspecified: Secondary | ICD-10-CM | POA: Diagnosis not present

## 2022-09-03 DIAGNOSIS — Z1211 Encounter for screening for malignant neoplasm of colon: Secondary | ICD-10-CM

## 2022-09-03 DIAGNOSIS — Z789 Other specified health status: Secondary | ICD-10-CM | POA: Diagnosis not present

## 2022-09-03 LAB — COMPREHENSIVE METABOLIC PANEL
ALT: 9 U/L (ref 0–35)
AST: 15 U/L (ref 0–37)
Albumin: 4.7 g/dL (ref 3.5–5.2)
Alkaline Phosphatase: 73 U/L (ref 39–117)
BUN: 14 mg/dL (ref 6–23)
CO2: 28 mEq/L (ref 19–32)
Calcium: 9.5 mg/dL (ref 8.4–10.5)
Chloride: 102 mEq/L (ref 96–112)
Creatinine, Ser: 0.79 mg/dL (ref 0.40–1.20)
GFR: 89.62 mL/min (ref >60.00)
Glucose, Bld: 107 mg/dL — ABNORMAL HIGH (ref 70–99)
Potassium: 3.8 mEq/L (ref 3.5–5.1)
Sodium: 137 mEq/L (ref 135–145)
Total Bilirubin: 0.6 mg/dL (ref 0.2–1.2)
Total Protein: 7.3 g/dL (ref 6.0–8.3)

## 2022-09-03 LAB — CBC WITH DIFFERENTIAL/PLATELET
Basophils Absolute: 0 10*3/uL (ref 0.0–0.1)
Basophils Relative: 0.7 % (ref 0.0–3.0)
Eosinophils Absolute: 0.1 10*3/uL (ref 0.0–0.7)
Eosinophils Relative: 1.4 % (ref 0.0–5.0)
HCT: 40.2 % (ref 36.0–46.0)
Hemoglobin: 13.4 g/dL (ref 12.0–15.0)
Lymphocytes Relative: 31.4 % (ref 12.0–46.0)
Lymphs Abs: 1.6 10*3/uL (ref 0.7–4.0)
MCHC: 33.3 g/dL (ref 30.0–36.0)
MCV: 86.8 fl (ref 78.0–100.0)
Monocytes Absolute: 0.3 10*3/uL (ref 0.1–1.0)
Monocytes Relative: 6.2 % (ref 3.0–12.0)
Neutro Abs: 3.1 10*3/uL (ref 1.4–7.7)
Neutrophils Relative %: 60.3 % (ref 43.0–77.0)
Platelets: 285 10*3/uL (ref 150.0–400.0)
RBC: 4.63 Mil/uL (ref 3.87–5.11)
RDW: 13.2 % (ref 11.5–15.5)
WBC: 5.2 10*3/uL (ref 4.0–10.5)

## 2022-09-03 LAB — LIPID PANEL
Cholesterol: 153 mg/dL (ref 0–200)
HDL: 51.6 mg/dL (ref >39.00)
LDL Cholesterol: 84 mg/dL (ref 0–99)
NonHDL: 101.66
Total CHOL/HDL Ratio: 3
Triglycerides: 87 mg/dL (ref 0.0–149.0)
VLDL: 17.4 mg/dL (ref 0.0–40.0)

## 2022-09-03 LAB — TSH: TSH: 0.2 u[IU]/mL — ABNORMAL LOW (ref 0.35–5.50)

## 2022-09-03 LAB — HEMOGLOBIN A1C: Hgb A1c MFr Bld: 6 % (ref 4.6–6.5)

## 2022-09-03 LAB — VITAMIN D 25 HYDROXY (VIT D DEFICIENCY, FRACTURES): VITD: 42.54 ng/mL (ref 30.00–100.00)

## 2022-09-03 LAB — VITAMIN B12: Vitamin B-12: 298 pg/mL (ref 211–911)

## 2022-09-03 MED ORDER — ZOLPIDEM TARTRATE 10 MG PO TABS: 10.0000 mg | ORAL_TABLET | Freq: Every evening | ORAL | 0 refills | Status: DC | PRN

## 2022-09-03 NOTE — Patient Instructions (Signed)
It was very nice to see you today!  See gynecology for pap and IUD   PLEASE NOTE:  If you had any lab tests please let us know if you have not heard back within a few days. You may see your results on MyChart before we have a chance to review them but we will give you a call once they are reviewed by [place]. If we ordered any referrals today, please let us know if you have not heard from their office within the next week.   Please try these tips to maintain a healthy lifestyle:  Eat most of your calories during the day when you are active. Eliminate processed foods including packaged sweets (pies, cakes, cookies), reduce intake of potatoes, white bread, white pasta, and white rice. Look for whole grain options, oat flour or almond flour.  Each meal should contain half fruits/vegetables, one quarter protein, and one quarter carbs (no bigger than a computer mouse).  Cut down on sweet beverages. This includes juice, soda, and sweet tea. Also watch fruit intake, though this is a healthier sweet option, it still contains natural sugar! Limit to 3 servings daily.  Drink at least 1 glass of water with each meal and aim for at least 8 glasses per day  Exercise at least 150 minutes every week.

## 2022-09-03 NOTE — Progress Notes (Signed)
Phone [phone]   Subjective:   Patient is a 47 y.o. female presenting for annual physical.    Chief Complaint  Patient presents with   Annual Exam    CPE Not fasting    Annual-occasional exercise Going to [place] and has trouble sleeping.  Requesting Ambien. Will be gone 1 week(s).   See problem oriented charting- ROS- ROS: Gen: no fever, chills  Skin: no rash, itching ENT: no ear pain, ear drainage, nasal congestion, rhinorrhea, sinus pressure, sore throat Eyes: no blurry vision, double vision Resp: no cough, wheeze,SOB CV: no CP,  LE edema,   + occasional palpitations-variable-worse if anxious.  Lasts 2-40min.  Can be shortness of breath-occasional runs 180.   GI: no heartburn, n/v/d/c, abd pain GU: no dysuria, urgency, frequency, hematuria MSK: no joint pain, myalgias, back pain Neuro: no dizziness, headache, weakness, vertigo Psych: no depression, anxiety, insomnia, SI   The following were reviewed and entered/updated in epic: Past Medical History:  Diagnosis Date   Allergy    Anemia    History of COVID-19 07/2020   fever/fatigue x 10 days all symptoms resolved   History of recurrent UTIs    kidney stone    Pneumonia 05/31/2021   all symptoms resolved by 06-05-2021 per pt   Wears glasses for reading    Patient Active Problem List   Diagnosis Date Noted   Family history of early CAD 07/22/2018   IUD (intrauterine device) in place 03/24/2018   Past Surgical History:  Procedure Laterality Date   [place]  2009, 2012   CYSTOSCOPY/URETEROSCOPY/HOLMIUM LASER/STENT PLACEMENT Left 06/28/2021   Procedure: CYSTOSCOPY LEFT URETEROSCOPY/HOLMIUM LASER/STENT PLACEMENT;  Surgeon: Lucas Mallow, MD;  Location: [place];  Service: Urology;  Laterality: Left;    Family History  Problem Relation Age of Onset   Depression Father    Heart attack Father    Heart disease Father    Hyperlipidemia Father    Hypertension  Father    Mental illness Father    Diabetes Brother    Asthma Daughter    Asthma Son    Heart attack Maternal Grandfather    Heart disease Maternal Grandfather    Asthma Daughter     Medications- reviewed and updated Current Outpatient Medications  Medication Sig Dispense Refill   esomeprazole (NEXIUM) 20 MG capsule Take 20 mg by mouth daily as needed.     levonorgestrel (MIRENA) 20 MCG/24HR IUD Mirena 20 mcg/24 hours (5 yrs) 52 mg intrauterine device  Take 1 device by intrauterine route.     Vitamin D, Ergocalciferol, (DRISDOL) 1.25 MG (50000 UNIT) CAPS capsule Take 1 capsule (50,000 Units total) by mouth every 7 (seven) days. 12 capsule 0   zolpidem (AMBIEN) 10 MG tablet Take 1 tablet (10 mg total) by mouth at bedtime as needed for sleep. 7 tablet 0   No current facility-administered medications for this visit.    Allergies-reviewed and updated No Known Allergies  Social History   Social History Narrative   Not on file   Objective  Objective:  BP 122/84   Pulse 89   Temp 98.1 F (36.7 C) (Temporal)   Ht 5\' 6"  (1.676 m)   Wt 174 lb (78.9 kg)   SpO2 97%   BMI 28.08 kg/m  Physical Exam  Gen: WDWN NAD HEENT: NCAT, conjunctiva not injected, sclera nonicteric TM WNL B, OP moist, no exudates  NECK:  supple, no thyromegaly, no nodes, no carotid bruits  CARDIAC: RRR, S1S2+, no murmur. DP 2+B LUNGS: CTAB. No wheezes ABDOMEN:  BS+, soft, sl tender suprapubically, No HSM, no masses EXT:  no edema MSK: no gross abnormalities. MS 5/5 all 4 NEURO: A&O x3.  CN II-XII intact.  PSYCH: normal mood. Good eye contact    Assessment and Plan   Health Maintenance counseling: 1. Anticipatory guidance: Patient counseled regarding regular dental exams q6 months, eye exams,  avoiding smoking and second hand smoke, limiting alcohol to 1 beverage per day, no illicit drugs.   2. Risk factor reduction:  Advised patient of need for regular exercise and diet rich and fruits and vegetables to  reduce risk of heart attack and stroke. Exercise- encourage.  Wt Readings from Last 3 Encounters:  09/03/22 174 lb (78.9 kg)  01/09/22 175 lb (79.4 kg)  08/15/21 170 lb (77.1 kg)   3. Immunizations/screenings/ancillary studies Immunization History  Administered Date(s) Administered   Tdap 03/24/2018   Health Maintenance Due  Topic Date Due   Hepatitis C Screening  Never done   PAP SMEAR-Modifier  02/18/2020   COLONOSCOPY (Pts 45-87yrs Insurance coverage will need to be confirmed)  Never done    4. Cervical cancer screening- patient to sch 5. Breast cancer screening-  mammogram utd 6. Colon cancer screening - ordered 7. Skin cancer screening- advised regular sunscreen use. Denies worrisome, changing, or new skin lesions.  8. Birth control/STD check- IUD 9. Osteoporosis screening- n/a 10. Smoking associated screening - non smoker  Problem List Items Addressed This Visit   None Visit Diagnoses     Wellness examination    -  Primary   Relevant Orders   Comprehensive metabolic panel   Hemoglobin A1c   Lipid panel   TSH   CBC with Differential/Platelet   Vitamin B12   VITAMIN D 25 Hydroxy (Vit-D Deficiency, Fractures)   Cologuard   Vitamin D deficiency       Relevant Orders   VITAMIN D 25 Hydroxy (Vit-D Deficiency, Fractures)   Vegetarian       Relevant Orders   Vitamin B12   VITAMIN D 25 Hydroxy (Vit-D Deficiency, Fractures)   Palpitation       Relevant Orders   Comprehensive metabolic panel   TSH   Screening for colon cancer       Relevant Orders   Cologuard       Recommended follow up: annual Return in about 1 year (around 09/03/2023) for annual. No future appointments.  Lab/Order associations:non fasting-ate yogurt on way in here   ICD-10-CM   1. Wellness examination  Z00.00 Comprehensive metabolic panel    Hemoglobin A1c    Lipid panel    TSH    CBC with Differential/Platelet    Vitamin B12    VITAMIN D 25 Hydroxy (Vit-D Deficiency, Fractures)     Cologuard    2. Vitamin D deficiency  E55.9 VITAMIN D 25 Hydroxy (Vit-D Deficiency, Fractures)    3. Vegetarian  Z78.9 Vitamin B12    VITAMIN D 25 Hydroxy (Vit-D Deficiency, Fractures)    4. Palpitation  R00.2 Comprehensive metabolic panel    TSH    5. Screening for colon cancer  Z12.11 Cologuard     Wellness-anticipatory guidance.  Work on [employer].  Check CBC,CMP,lipids,TSH, A1C.  F/u 1 yr  see gynecology for pap/IUD Vitamin D deficiency-on supplement in past-check D Vegetarian-check D/B12 Palpitations-occasional.  Keep log.  Try coughing.  Worse, etc, let [place] know Travel-prescription Ambien 10 mg-prescription drug monitoring program checked.  Meds ordered this encounter  Medications   zolpidem (AMBIEN) 10 MG tablet    Sig: Take 1 tablet (10 mg total) by mouth at bedtime as needed for sleep.    Dispense:  7 tablet    Refill:  0    Wellington Hampshire, MD

## 2022-09-07 NOTE — Progress Notes (Signed)
Labs are great except: 1.A1C(3 month average of sugars) is elevated.  This is considered PreDiabetes.  Work on diet-decrease sugars and starches and aim for 30 minutes of exercise 5 days/week to prevent progression to diabetes 2.  Vitamin B12 is on the low end of normal-take B12 supplements over-the-counter 1000 mcg/day 3.  TSH is suppressed-which she taking any biotin supplements?  May be hyperthyroidism.  Need to repeat TSH, free T3, free T4 in a few weeks.  Make sure no supplements with biotin in it 1 week prior to labs.

## 2022-09-09 ENCOUNTER — Other Ambulatory Visit: Payer: Self-pay | Admitting: *Deleted

## 2022-09-09 DIAGNOSIS — R7989 Other specified abnormal findings of blood chemistry: Secondary | ICD-10-CM

## 2022-09-16 ENCOUNTER — Other Ambulatory Visit (INDEPENDENT_AMBULATORY_CARE_PROVIDER_SITE_OTHER): Payer: BC Managed Care – PPO

## 2022-09-16 DIAGNOSIS — R7989 Other specified abnormal findings of blood chemistry: Secondary | ICD-10-CM | POA: Diagnosis not present

## 2022-09-16 LAB — T3, FREE: T3, Free: 3.6 pg/mL (ref 2.3–4.2)

## 2022-09-16 LAB — T4, FREE: Free T4: 0.97 ng/dL (ref 0.60–1.60)

## 2022-09-16 LAB — TSH: TSH: 0.18 u[IU]/mL — ABNORMAL LOW (ref 0.35–5.50)

## 2022-09-16 NOTE — Progress Notes (Signed)
TSH is still suppressed but free T4 and free T3 are within normal limits.  Things may be changing to hyper thyroidism.  Bit concerned as she is having some heart racing.  If things worsen, let us know.  Otherwise, repeat TSH, free T3, free T4 in 1 month

## 2022-09-17 ENCOUNTER — Other Ambulatory Visit: Payer: Self-pay | Admitting: *Deleted

## 2022-09-17 DIAGNOSIS — R7989 Other specified abnormal findings of blood chemistry: Secondary | ICD-10-CM

## 2022-10-04 DIAGNOSIS — Z1211 Encounter for screening for malignant neoplasm of colon: Secondary | ICD-10-CM | POA: Diagnosis not present

## 2022-10-11 LAB — COLOGUARD: COLOGUARD: POSITIVE — AB

## 2022-10-11 NOTE — Progress Notes (Signed)
Please let her know the cologard is positive.  Not panic as not definitive for cancer but please refer gastroenterology for colonoscopy for + test

## 2022-10-13 ENCOUNTER — Encounter: Payer: Self-pay | Admitting: Gastroenterology

## 2022-10-13 ENCOUNTER — Other Ambulatory Visit: Payer: Self-pay | Admitting: *Deleted

## 2022-10-13 DIAGNOSIS — R195 Other fecal abnormalities: Secondary | ICD-10-CM

## 2022-10-17 ENCOUNTER — Other Ambulatory Visit (INDEPENDENT_AMBULATORY_CARE_PROVIDER_SITE_OTHER): Payer: BC Managed Care – PPO

## 2022-10-17 DIAGNOSIS — R7989 Other specified abnormal findings of blood chemistry: Secondary | ICD-10-CM | POA: Diagnosis not present

## 2022-10-17 LAB — T3, FREE: T3, Free: 3.5 pg/mL (ref 2.3–4.2)

## 2022-10-17 LAB — T4, FREE: Free T4: 0.93 ng/dL (ref 0.60–1.60)

## 2022-10-17 LAB — TSH: TSH: 0.15 u[IU]/mL — ABNORMAL LOW (ref 0.35–5.50)

## 2022-10-19 NOTE — Progress Notes (Signed)
TSH still suppressed but FT3 and free T4 ok.   Does she take biotin supplements?   Any syptoms?.  Repeat TSH,FT3,free T4 in 3 months and make sure not vitamins/Biotin for 4 days prior to labs

## 2022-10-20 ENCOUNTER — Other Ambulatory Visit: Payer: Self-pay | Admitting: *Deleted

## 2022-10-20 DIAGNOSIS — R7989 Other specified abnormal findings of blood chemistry: Secondary | ICD-10-CM

## 2022-10-21 ENCOUNTER — Encounter: Payer: Self-pay | Admitting: *Deleted

## 2022-10-30 ENCOUNTER — Ambulatory Visit (AMBULATORY_SURGERY_CENTER): Payer: BC Managed Care – PPO

## 2022-10-30 VITALS — Ht 69.5 in | Wt 158.0 lb

## 2022-10-30 DIAGNOSIS — R195 Other fecal abnormalities: Secondary | ICD-10-CM

## 2022-10-30 DIAGNOSIS — Z1211 Encounter for screening for malignant neoplasm of colon: Secondary | ICD-10-CM

## 2022-10-30 MED ORDER — NA SULFATE-K SULFATE-MG SULF 17.5-3.13-1.6 GM/177ML PO SOLN: 1.0000 | Freq: Once | ORAL | 0 refills | Status: AC

## 2022-10-30 NOTE — Progress Notes (Signed)

## 2022-11-05 ENCOUNTER — Encounter: Payer: Self-pay | Admitting: Gastroenterology

## 2022-11-13 ENCOUNTER — Ambulatory Visit (AMBULATORY_SURGERY_CENTER): Payer: BC Managed Care – PPO | Admitting: Gastroenterology

## 2022-11-13 ENCOUNTER — Encounter: Payer: Self-pay | Admitting: Gastroenterology

## 2022-11-13 VITALS — BP 109/62 | HR 76 | Temp 98.0°F | Resp 14 | Ht 69.0 in | Wt 158.0 lb

## 2022-11-13 DIAGNOSIS — D128 Benign neoplasm of rectum: Secondary | ICD-10-CM | POA: Diagnosis not present

## 2022-11-13 DIAGNOSIS — K621 Rectal polyp: Secondary | ICD-10-CM | POA: Diagnosis not present

## 2022-11-13 DIAGNOSIS — Z1211 Encounter for screening for malignant neoplasm of colon: Secondary | ICD-10-CM | POA: Diagnosis not present

## 2022-11-13 DIAGNOSIS — R195 Other fecal abnormalities: Secondary | ICD-10-CM

## 2022-11-13 MED ORDER — SODIUM CHLORIDE 0.9 % IV SOLN: 500.0000 mL | Freq: Once | INTRAVENOUS | Status: DC

## 2022-11-13 NOTE — Progress Notes (Signed)
Pt's states no medical or surgical changes since previsit or office visit. 

## 2022-11-13 NOTE — Patient Instructions (Addendum)
Resume previous diet Continue present medications Await pathology results Repeat colonoscopy in 5 years due to adequate prep, use a more extensive prep in the future Handouts/information given for polyps  YOU HAD AN ENDOSCOPIC PROCEDURE TODAY AT THE [facility]:   Refer to the procedure report that was given to you for any specific questions about what was found during the examination.  If the procedure report does not answer your questions, please call your gastroenterologist to clarify.  If you requested that your care partner not be given the details of your procedure findings, then the procedure report has been included in a sealed envelope for you to review at your convenience later.  YOU SHOULD EXPECT: Some feelings of bloating in the abdomen. Passage of more gas than usual.  Walking can help get rid of the air that was put into your GI tract during the procedure and reduce the bloating. If you had a lower endoscopy (such as a colonoscopy or flexible sigmoidoscopy) you may notice spotting of blood in your stool or on the toilet paper. If you underwent a bowel prep for your procedure, you may not have a normal bowel movement for a few days.  Please Note:  You might notice some irritation and congestion in your nose or some drainage.  This is from the oxygen used during your procedure.  There is no need for concern and it should clear up in a day or so.  SYMPTOMS TO REPORT IMMEDIATELY:  Following lower endoscopy (colonoscopy):  Excessive amounts of blood in the stool  Significant tenderness or worsening of abdominal pains  Swelling of the abdomen that is new, acute  Fever of 100F or higher  For urgent or emergent issues, a gastroenterologist can be reached at any hour by calling [phone]. Do not use MyChart messaging for urgent concerns.   DIET:  We do recommend a small meal at first, but then you may proceed to your regular diet.  Drink plenty of fluids but you should  avoid alcoholic beverages for 24 hours.  ACTIVITY:  You should plan to take it easy for the rest of today and you should NOT DRIVE or use heavy machinery until tomorrow (because of the sedation medicines used during the test).    FOLLOW UP: Our staff will call the number listed on your records the next business day following your procedure.  We will call around 7:15- 8:00 am to check on you and address any questions or concerns that you may have regarding the information given to you following your procedure. If we do not reach you, we will leave a message.     If any biopsies were taken you will be contacted by phone or by letter within the next 1-3 weeks.  Please call us at [phone] if you have not heard about the biopsies in 3 weeks.    SIGNATURES/CONFIDENTIALITY: You and/or your care partner have signed paperwork which will be entered into your electronic medical record.  These signatures attest to the fact that that the information above on your After Visit Summary has been reviewed and is understood.  Full responsibility of the confidentiality of this discharge information lies with you and/or your care-partner.

## 2022-11-13 NOTE — Progress Notes (Signed)
Called to room to assist during endoscopic procedure.  Patient ID and intended procedure confirmed with present staff. Received instructions for my participation in the procedure from the performing physician.  

## 2022-11-13 NOTE — Progress Notes (Signed)
Uneventful anesthetic. Report to pacu rn. Vss. Care resumed by rn. 

## 2022-11-13 NOTE — Op Note (Signed)
[facility] Patient Name: Gabrielle Zamora Procedure Date: 11/13/2022 10:33 AM MRN: [id number] Endoscopist: Meryl Dare , MD, [phone] Age: 47 Referring MD:  Date of Birth: April 13, 1976 Gender: Female Account #: [id number] Procedure:                Colonoscopy Indications:              Positive Cologuard test Medicines:                Monitored Anesthesia Care Procedure:                Pre-Anesthesia Assessment:                           - Prior to the procedure, a History and Physical                            was performed, and patient medications and                            allergies were reviewed. The patient's tolerance of                            previous anesthesia was also reviewed. The risks                            and benefits of the procedure and the sedation                            options and risks were discussed with the patient.                            All questions were answered, and informed consent                            was obtained. Prior Anticoagulants: The patient has                            taken no anticoagulant or antiplatelet agents. ASA                            Grade Assessment: II - A patient with mild systemic                            disease. After reviewing the risks and benefits,                            the patient was deemed in satisfactory condition to                            undergo the procedure.                           After obtaining informed consent, the colonoscope  was passed under direct vision. Throughout the                            procedure, the patient's blood pressure, pulse, and                            oxygen saturations were monitored continuously. The                            Olympus CF-HQ190L ([id number]) Colonoscope was                            introduced through the anus and advanced to the the                            cecum, identified by appendiceal  orifice and                            ileocecal valve. The ileocecal valve, appendiceal                            orifice, and rectum were photographed. The quality                            of the bowel preparation was adequate after                            extensive lavage, suction. The colonoscopy was                            performed without difficulty. The patient tolerated                            the procedure well. Scope In: 10:50:23 AM Scope Out: 11:12:23 AM Scope Withdrawal Time: 0 hours 15 minutes 28 seconds  Total Procedure Duration: 0 hours 22 minutes 0 seconds  Findings:                 The perianal and digital rectal examinations were                            normal.                           A 6 mm polyp was found in the rectum. The polyp was                            sessile. The polyp was removed with a cold snare.                            Resection and retrieval were complete.                           The exam was otherwise without abnormality on  direct and retroflexion views. Complications:            No immediate complications. Estimated blood loss:                            None. Estimated Blood Loss:     Estimated blood loss: none. Impression:               - One 6 mm polyp in the rectum, removed with a cold                            snare. Resected and retrieved.                           - The examination was otherwise normal on direct                            and retroflexion views. Recommendation:           - Repeat colonoscopy in 5 years for surveillance (5                            year interval due to prep) with a more extensive                            bowel prep.                           - Patient has a contact number available for                            emergencies. The signs and symptoms of potential                            delayed complications were discussed with the                             patient. Return to normal activities tomorrow.                            Written discharge instructions were provided to the                            patient.                           - Resume previous diet.                           - Continue present medications.                           - Await pathology results. Meryl Dare, MD 11/13/2022 11:15:40 AM This report has been signed electronically.

## 2022-11-13 NOTE — Progress Notes (Signed)
History & Physical  Primary Care Physician:  Jeani Sow, MD Primary Gastroenterologist: Claudette Head, MD  Impression / Plan:  Positive Cologuard for colonoscopy  CHIEF COMPLAINT: Positive Cologuard  HPI: Gabrielle Zamora is a 47 y.o. female with a positive Cologuard for colonoscopy.    Past Medical History:  Diagnosis Date   Allergy    Anemia    History of COVID-19 07/2020   fever/fatigue x 10 days all symptoms resolved   History of recurrent UTIs    kidney stone    Pneumonia 05/31/2021   all symptoms resolved by 06-05-2021 per pt   Wears glasses for reading     Past Surgical History:  Procedure Laterality Date   ADENOIDECTOMY  1985   CESAREAN SECTION  2009, 2012   CYSTOSCOPY/URETEROSCOPY/HOLMIUM LASER/STENT PLACEMENT Left 06/28/2021   Procedure: CYSTOSCOPY LEFT URETEROSCOPY/HOLMIUM LASER/STENT PLACEMENT;  Surgeon: Crista Elliot, MD;  Location: [facility];  Service: Urology;  Laterality: Left;    Prior to Admission medications   Medication Sig Start Date End Date Taking? Authorizing Provider  cyanocobalamin (VITAMIN B12) 1000 MCG tablet Take 1,000 mcg by mouth daily.   Yes [provider]  esomeprazole (NEXIUM) 20 MG capsule Take 20 mg by mouth daily as needed.   Yes [provider]  levonorgestrel (MIRENA) 20 MCG/24HR IUD Mirena 20 mcg/24 hours (5 yrs) 52 mg intrauterine device  Take 1 device by intrauterine route.   Yes [provider]  misoprostol (CYTOTEC) 200 MCG tablet INSERT TABLET INTRAVAGINALLY 3 HRS PRIOR TO PROCEDURE Patient not taking: Reported on 10/30/2022    [provider]  Vitamin D, Ergocalciferol, (DRISDOL) 1.25 MG (50000 UNIT) CAPS capsule Take 1 capsule (50,000 Units total) by mouth every 7 (seven) days. Patient not taking: Reported on 10/30/2022 01/09/22   Dulce Sellar, NP  zolpidem (AMBIEN) 10 MG tablet Take 1 tablet (10 mg total) by mouth at bedtime as needed for sleep. 09/03/22 10/03/22   Jeani Sow, MD    Current Outpatient Medications  Medication Sig Dispense Refill   cyanocobalamin (VITAMIN B12) 1000 MCG tablet Take 1,000 mcg by mouth daily.     esomeprazole (NEXIUM) 20 MG capsule Take 20 mg by mouth daily as needed.     levonorgestrel (MIRENA) 20 MCG/24HR IUD Mirena 20 mcg/24 hours (5 yrs) 52 mg intrauterine device  Take 1 device by intrauterine route.     misoprostol (CYTOTEC) 200 MCG tablet INSERT TABLET INTRAVAGINALLY 3 HRS PRIOR TO PROCEDURE (Patient not taking: Reported on 10/30/2022)     Vitamin D, Ergocalciferol, (DRISDOL) 1.25 MG (50000 UNIT) CAPS capsule Take 1 capsule (50,000 Units total) by mouth every 7 (seven) days. (Patient not taking: Reported on 10/30/2022) 12 capsule 0   zolpidem (AMBIEN) 10 MG tablet Take 1 tablet (10 mg total) by mouth at bedtime as needed for sleep. 7 tablet 0   Current Facility-Administered Medications  Medication Dose Route Frequency Provider Last Rate Last Admin   0.9 %  sodium chloride infusion  500 mL Intravenous Once Meryl Dare, MD        Allergies as of 11/13/2022   (No Known Allergies)    Family History  Problem Relation Age of Onset   Depression Father    Heart attack Father    Heart disease Father    Hyperlipidemia Father    Hypertension Father    Mental illness Father    Diabetes Brother    Stomach cancer Maternal Uncle    Heart attack  Maternal Grandfather    Heart disease Maternal Grandfather    Asthma Daughter    Asthma Daughter    Asthma Son    Colon cancer Neg Hx    Colon polyps Neg Hx    Rectal cancer Neg Hx    Esophageal cancer Neg Hx     Social History   Socioeconomic History   Marital status: Married    Spouse name: Not on file   Number of children: 3   Years of education: Not on file   Highest education level: Not on file  Occupational History   Occupation: Interior and spatial designer of [employer]  Tobacco Use   Smoking status: Former    Types: Cigarettes   Smokeless tobacco: Never  Vaping  Use   Vaping Use: Never used  Substance and Sexual Activity   Alcohol use: Yes   Drug use: Never   Sexual activity: Yes    Birth control/protection: I.U.D.    Comment: 07/2017  Other Topics Concern   Not on file  Social History Narrative   Not on file   Social Determinants of Health   Financial Resource Strain: Not on file  Food Insecurity: Not on file  Transportation Needs: Not on file  Physical Activity: Not on file  Stress: Not on file  Social Connections: Not on file  Intimate Partner Violence: Not on file    Review of Systems:  All systems reviewed were negative except where noted in HPI.   Physical Exam:  General:  Alert, well-developed, in NAD Head:  Normocephalic and atraumatic. Eyes:  Sclera clear, no icterus.   Conjunctiva pink. Ears:  Normal auditory acuity. Mouth:  No deformity or lesions.  Neck:  Supple; no masses. Lungs:  Clear throughout to auscultation.   No wheezes, crackles, or rhonchi.  Heart:  Regular rate and rhythm; no murmurs. Abdomen:  Soft, nondistended, nontender. No masses, hepatomegaly. No palpable masses.  Normal bowel sounds.    Rectal:  Deferred   Msk:  Symmetrical without gross deformities. Extremities:  Without edema. Neurologic:  Alert and  oriented x 4; grossly normal neurologically. Skin:  Intact without significant lesions or rashes. Psych:  Alert and cooperative. Normal mood and affect.  Venita Lick. Russella Dar  11/13/2022, 10:37 AM See Loretha Stapler, [facility], to contact our on call provider

## 2022-11-14 ENCOUNTER — Telehealth: Payer: Self-pay

## 2022-11-14 NOTE — Telephone Encounter (Signed)
Follow up call placed, VM obtained and message left. 

## 2022-11-20 ENCOUNTER — Encounter: Payer: Self-pay | Admitting: Gastroenterology

## 2023-01-08 ENCOUNTER — Ambulatory Visit: Payer: BC Managed Care – PPO | Admitting: Family

## 2023-01-08 ENCOUNTER — Other Ambulatory Visit (HOSPITAL_COMMUNITY)
Admission: RE | Admit: 2023-01-08 | Discharge: 2023-01-08 | Disposition: A | Discharge status: Home / Self Care | Payer: BC Managed Care – PPO | Source: Ambulatory Visit | Attending: Family | Admitting: Family

## 2023-01-08 VITALS — BP 117/70 | HR 62 | Temp 97.8°F | Ht 69.0 in | Wt 168.6 lb

## 2023-01-08 DIAGNOSIS — R339 Retention of urine, unspecified: Secondary | ICD-10-CM | POA: Diagnosis not present

## 2023-01-08 DIAGNOSIS — M545 Low back pain, unspecified: Secondary | ICD-10-CM | POA: Diagnosis not present

## 2023-01-08 DIAGNOSIS — R102 Pelvic and perineal pain: Secondary | ICD-10-CM | POA: Diagnosis not present

## 2023-01-08 LAB — POCT URINALYSIS DIPSTICK
Bilirubin, UA: NEGATIVE
Blood, UA: NEGATIVE
Glucose, UA: NEGATIVE
Ketones, UA: NEGATIVE
Leukocytes, UA: NEGATIVE
Nitrite, UA: NEGATIVE
Protein, UA: NEGATIVE
Spec Grav, UA: 1.02 (ref 1.010–1.025)
Urobilinogen, UA: 0.2 E.U./dL
pH, UA: 6.5 (ref 5.0–8.0)

## 2023-01-08 MED ORDER — TAMSULOSIN HCL 0.4 MG PO CAPS
0.4000 mg | ORAL_CAPSULE | Freq: Two times a day (BID) | ORAL | 0 refills | Status: DC
Start: 2023-01-08 — End: 2023-04-29

## 2023-01-08 NOTE — Progress Notes (Signed)
Patient ID: Gabrielle Zamora, female    DOB: 1975/08/01, 47 y.o.   MRN: [id number]  Chief Complaint  Patient presents with   Urinary Retention    Pt c/o abdominal pressure, urinary retention and odor, Present for 2 weeks. Pt states when she took an at home UTI kit , it showed Leukocytes. Pt tried tamsulosin.    HPI:      Urinary sx:    pt reports feeling off & on abdominal/pelvic pressure, low back pain/pressure, increase in urine odor, and urinary retention. Denies any hematuria, fever, or vaginal symptoms. She did a home UTI test yesterday that showed leuks only. Did not take any OTC meds.  Has not had a UTI in long time, but did have a kidney stone last year that had to be surgically removed. She is unsure if this pain is the same or not.    Assessment & Plan:  1. Urinary retention UA neg. Advised on drinking at least 2L water daily.  Will send vaginal swab to rule out BV.   - POCT Urinalysis Dipstick - Cervicovaginal ancillary only  2. Pelvic pain UA neg, sending out swab to rule out BV. Pt concerned if she is having another kidney stone. Sending Flomax in case her colicky pain worsens, advised on use, can call back & can order CT scan.  - Cervicovaginal ancillary only - tamsulosin (FLOMAX) 0.4 MG CAPS capsule; Take 1 capsule (0.4 mg total) by mouth 2 (two) times daily. Start if intermittent pain in low back worsens or moves to side or into pelvis.  Dispense: 20 capsule; Refill: 0  3. Acute right-sided low back pain without sciatica see pelvic pain above.  - tamsulosin (FLOMAX) 0.4 MG CAPS capsule; Take 1 capsule (0.4 mg total) by mouth 2 (two) times daily. Start if intermittent pain in low back worsens or moves to side or into pelvis.  Dispense: 20 capsule; Refill: 0  Subjective:    Outpatient Medications Prior to Visit  Medication Sig Dispense Refill   cyanocobalamin (VITAMIN B12) 1000 MCG tablet Take 1,000 mcg by mouth daily.     esomeprazole (NEXIUM) 20 MG capsule Take 20 mg  by mouth daily as needed.     levonorgestrel (MIRENA) 20 MCG/24HR IUD Mirena 20 mcg/24 hours (5 yrs) 52 mg intrauterine device  Take 1 device by intrauterine route.     Vitamin D, Ergocalciferol, (DRISDOL) 1.25 MG (50000 UNIT) CAPS capsule Take 1 capsule (50,000 Units total) by mouth every 7 (seven) days. 12 capsule 0   zolpidem (AMBIEN) 10 MG tablet Take 1 tablet (10 mg total) by mouth at bedtime as needed for sleep. 7 tablet 0   misoprostol (CYTOTEC) 200 MCG tablet  (Patient not taking: Reported on 01/08/2023)     No facility-administered medications prior to visit.   Past Medical History:  Diagnosis Date   Allergy    Anemia    History of COVID-19 07/2020   fever/fatigue x 10 days all symptoms resolved   History of recurrent UTIs    kidney stone    Pneumonia 05/31/2021   all symptoms resolved by 06-05-2021 per pt   Wears glasses for reading    Past Surgical History:  Procedure Laterality Date   ADENOIDECTOMY  1985   CESAREAN SECTION  2009, 2012   CYSTOSCOPY/URETEROSCOPY/HOLMIUM LASER/STENT PLACEMENT Left 06/28/2021   Procedure: CYSTOSCOPY LEFT URETEROSCOPY/HOLMIUM LASER/STENT PLACEMENT;  Surgeon: Crista Elliot, MD;  Location: [facility];  Service: Urology;  Laterality: Left;   No  Known Allergies    Objective:    Physical Exam Vitals and nursing note reviewed.  Constitutional:      Appearance: Normal appearance.  Cardiovascular:     Rate and Rhythm: Normal rate and regular rhythm.  Pulmonary:     Effort: Pulmonary effort is normal.     Breath sounds: Normal breath sounds.  Musculoskeletal:        General: Normal range of motion.  Skin:    General: Skin is warm and dry.  Neurological:     Mental Status: She is alert.  Psychiatric:        Mood and Affect: Mood normal.        Behavior: Behavior normal.    BP 117/70   Pulse 62   Temp 97.8 F (36.6 C) (Temporal)   Ht 5\' 9"  (1.753 m)   Wt 168 lb 9.6 oz (76.5 kg)   SpO2 98%   BMI 24.90 kg/m  Wt  Readings from Last 3 Encounters:  01/08/23 168 lb 9.6 oz (76.5 kg)  11/13/22 158 lb (71.7 kg)  10/30/22 158 lb (71.7 kg)      Dulce Sellar, NP

## 2023-02-24 ENCOUNTER — Encounter (HOSPITAL_BASED_OUTPATIENT_CLINIC_OR_DEPARTMENT_OTHER): Payer: Self-pay | Admitting: Emergency Medicine

## 2023-02-24 ENCOUNTER — Emergency Department (HOSPITAL_BASED_OUTPATIENT_CLINIC_OR_DEPARTMENT_OTHER)
Admission: EM | Admit: 2023-02-24 | Discharge: 2023-02-24 | Disposition: A | Discharge status: Home / Self Care | Payer: BC Managed Care – PPO | Attending: Emergency Medicine | Admitting: Emergency Medicine

## 2023-02-24 DIAGNOSIS — Y92410 Unspecified street and highway as the place of occurrence of the external cause: Secondary | ICD-10-CM | POA: Diagnosis not present

## 2023-02-24 DIAGNOSIS — M791 Myalgia, unspecified site: Secondary | ICD-10-CM | POA: Diagnosis not present

## 2023-02-24 DIAGNOSIS — M546 Pain in thoracic spine: Secondary | ICD-10-CM | POA: Diagnosis not present

## 2023-02-24 DIAGNOSIS — M7918 Myalgia, other site: Secondary | ICD-10-CM

## 2023-02-24 MED ORDER — METHOCARBAMOL 500 MG PO TABS: 1000.0000 mg | ORAL_TABLET | Freq: Four times a day (QID) | ORAL | 0 refills | Status: AC
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
## 2023-03-03 ENCOUNTER — Encounter: Payer: Self-pay | Admitting: Physician Assistant

## 2023-03-03 ENCOUNTER — Ambulatory Visit: Payer: BC Managed Care – PPO | Admitting: Physician Assistant

## 2023-03-03 VITALS — BP 110/70 | HR 69 | Temp 97.8°F | Ht 69.0 in | Wt 167.4 lb

## 2023-03-03 DIAGNOSIS — M79602 Pain in left arm: Secondary | ICD-10-CM | POA: Diagnosis not present

## 2023-03-03 DIAGNOSIS — S060X0A Concussion without loss of consciousness, initial encounter: Secondary | ICD-10-CM | POA: Diagnosis not present

## 2023-03-03 MED ORDER — MELOXICAM 15 MG PO TABS: 15.0000 mg | ORAL_TABLET | Freq: Every day | ORAL | 0 refills | Status: AC
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
## 2023-03-03 NOTE — Patient Instructions (Signed)
It was great to see you!  Start daily mobic 15 mg daily  See Dr Jean Rosenthal tomorrow -- see appointment info below  Activity Limit activities that need a lot of thought or focus, such as: Homework or work for your job. Watching TV. Using the computer or phone. Playing memory games and puzzles. Get rest because this helps your brain heal. Make sure you: Get plenty of sleep. Most adults should get 7-9 hours of sleep each night. Rest during the day. Take naps or breaks when you feel tired. Avoid activity or exercise that takes a lot of effort until your doctor says it is safe. Stop any activity that makes symptoms worse. Your doctor may tell you to do light exercise like walking. Do not do activities that could cause a second concussion, such as riding a bike or playing sports. Ask your doctor when you can return to your normal activities, such as school, work, sports, and driving. Your ability to react may be slower. Do not do these activities if you are dizzy.  Take care,  Jarold Motto PA-C

## 2023-03-04 ENCOUNTER — Ambulatory Visit (INDEPENDENT_AMBULATORY_CARE_PROVIDER_SITE_OTHER): Payer: BC Managed Care – PPO | Admitting: Sports Medicine

## 2023-03-04 VITALS — BP 130/82 | HR 83 | Ht 69.0 in | Wt 169.0 lb

## 2023-03-04 DIAGNOSIS — S060X0A Concussion without loss of consciousness, initial encounter: Secondary | ICD-10-CM
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
## 2023-03-04 NOTE — Patient Instructions (Addendum)
Work note provided out of work remainder of this week. Can return to work 4 hours a day maximum starting 03/09/2023 until  1 week follow up  Start melatonin 5 mg nighty  - Relative mental and physical rest for 48 hours after concussive event -Recommend light aerobic activity while keeping symptoms less than 3/10 -Stop mental or physical activities that cause symptoms to worsen greater than 3/10, and wait 24 hours before attempting them again -Eliminate screen time as much as possible for first 48 hours after concussive event, then continue limited screen time (recommend less than 2 hours per day)
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
## 2023-03-11 ENCOUNTER — Ambulatory Visit: Payer: BC Managed Care – PPO | Admitting: Sports Medicine

## 2023-03-11 VITALS — BP 132/84 | HR 77 | Ht 69.0 in | Wt 169.0 lb

## 2023-03-11 DIAGNOSIS — S060X0D Concussion without loss of consciousness, subsequent encounter: Secondary | ICD-10-CM

## 2023-03-11 NOTE — Patient Instructions (Signed)
Work note provided 6 hours this week. 03/16/2023 can increase to 8 hours per day . 03/23/2023 no work restrictions 3 week follow up
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
## 2023-03-26 DIAGNOSIS — Z30431 Encounter for routine checking of intrauterine contraceptive device: Secondary | ICD-10-CM | POA: Diagnosis not present

## 2023-03-26 DIAGNOSIS — Z124 Encounter for screening for malignant neoplasm of cervix: Secondary | ICD-10-CM | POA: Diagnosis not present

## 2023-03-26 DIAGNOSIS — Z8639 Personal history of other endocrine, nutritional and metabolic disease: Secondary | ICD-10-CM | POA: Diagnosis not present

## 2023-03-26 DIAGNOSIS — N941 Unspecified dyspareunia: Secondary | ICD-10-CM | POA: Diagnosis not present

## 2023-03-26 DIAGNOSIS — Z1231 Encounter for screening mammogram for malignant neoplasm of breast: Secondary | ICD-10-CM | POA: Diagnosis not present

## 2023-03-26 DIAGNOSIS — Z01419 Encounter for gynecological examination (general) (routine) without abnormal findings: Secondary | ICD-10-CM | POA: Diagnosis not present

## 2023-03-26 DIAGNOSIS — Z1151 Encounter for screening for human papillomavirus (HPV): Secondary | ICD-10-CM | POA: Diagnosis not present

## 2023-03-26 DIAGNOSIS — Z113 Encounter for screening for infections with a predominantly sexual mode of transmission: Secondary | ICD-10-CM | POA: Diagnosis not present

## 2023-04-01 ENCOUNTER — Encounter: Payer: BC Managed Care – PPO | Admitting: Sports Medicine

## 2023-04-01 ENCOUNTER — Ambulatory Visit (INDEPENDENT_AMBULATORY_CARE_PROVIDER_SITE_OTHER): Payer: BC Managed Care – PPO | Admitting: Sports Medicine

## 2023-04-01 VITALS — BP 126/84 | HR 77 | Ht 69.0 in | Wt 167.0 lb

## 2023-04-01 DIAGNOSIS — S060X0D Concussion without loss of consciousness, subsequent encounter: Secondary | ICD-10-CM

## 2023-04-01 DIAGNOSIS — M542 Cervicalgia: Secondary | ICD-10-CM | POA: Diagnosis not present

## 2023-04-01 DIAGNOSIS — S46812A Strain of other muscles, fascia and tendons at shoulder and upper arm level, left arm, initial encounter: Secondary | ICD-10-CM

## 2023-04-01 NOTE — Patient Instructions (Signed)
Work note provided 6 hours maximum for 2 weeks. 04/20/2023 can increase to 8 hours per day until re-evaluated. 3 week follow pup
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
## 2023-04-21 ENCOUNTER — Ambulatory Visit (INDEPENDENT_AMBULATORY_CARE_PROVIDER_SITE_OTHER): Payer: BC Managed Care – PPO | Admitting: Sports Medicine

## 2023-04-21 VITALS — BP 132/82 | HR 74 | Ht 69.0 in | Wt 167.0 lb

## 2023-04-21 DIAGNOSIS — R110 Nausea: Secondary | ICD-10-CM | POA: Diagnosis not present

## 2023-04-21 DIAGNOSIS — S060X0D Concussion without loss of consciousness, subsequent encounter: Secondary | ICD-10-CM | POA: Diagnosis not present

## 2023-04-21 DIAGNOSIS — R4189 Other symptoms and signs involving cognitive functions and awareness: Secondary | ICD-10-CM

## 2023-04-21 DIAGNOSIS — R270 Ataxia, unspecified: Secondary | ICD-10-CM | POA: Diagnosis not present

## 2023-04-21 NOTE — Patient Instructions (Addendum)
Brain MRI  Follow up 5 days after to discuss results  Work note provided 4 hours maximum, with 15 min rest breaks every hour as needed until 05/12/2023 PT referral

## 2023-04-27 ENCOUNTER — Ambulatory Visit: Payer: BC Managed Care – PPO

## 2023-04-27 ENCOUNTER — Encounter: Payer: Self-pay | Admitting: Sports Medicine

## 2023-04-27 DIAGNOSIS — F40240 Claustrophobia: Secondary | ICD-10-CM

## 2023-04-27 MED ORDER — LORAZEPAM 0.5 MG PO TABS: ORAL_TABLET | ORAL | 0 refills | Status: DC

## 2023-04-28 ENCOUNTER — Other Ambulatory Visit: Payer: BC Managed Care – PPO

## 2023-04-28 DIAGNOSIS — R110 Nausea: Secondary | ICD-10-CM | POA: Diagnosis not present

## 2023-04-28 DIAGNOSIS — R270 Ataxia, unspecified: Secondary | ICD-10-CM | POA: Diagnosis not present

## 2023-04-28 DIAGNOSIS — M542 Cervicalgia: Secondary | ICD-10-CM | POA: Diagnosis not present

## 2023-04-28 DIAGNOSIS — R2681 Unsteadiness on feet: Secondary | ICD-10-CM | POA: Diagnosis not present

## 2023-04-28 DIAGNOSIS — S060X0S Concussion without loss of consciousness, sequela: Secondary | ICD-10-CM | POA: Diagnosis not present

## 2023-04-28 DIAGNOSIS — R4189 Other symptoms and signs involving cognitive functions and awareness: Secondary | ICD-10-CM

## 2023-04-28 DIAGNOSIS — S060X0D Concussion without loss of consciousness, subsequent encounter: Secondary | ICD-10-CM | POA: Diagnosis not present

## 2023-04-28 DIAGNOSIS — S060X0A Concussion without loss of consciousness, initial encounter: Secondary | ICD-10-CM | POA: Diagnosis not present

## 2023-04-28 MED ORDER — GADOBUTROL 1 MMOL/ML IV SOLN
7.0000 mL | Freq: Once | INTRAVENOUS | Status: AC | PRN
Start: 2023-04-28 — End: 2023-04-28
  Administered 2023-04-28: 7 mL via INTRAVENOUS

## 2023-04-29 ENCOUNTER — Other Ambulatory Visit: Payer: Self-pay | Admitting: Family

## 2023-04-29 DIAGNOSIS — M545 Low back pain, unspecified: Secondary | ICD-10-CM

## 2023-04-29 DIAGNOSIS — R102 Pelvic and perineal pain: Secondary | ICD-10-CM

## 2023-04-29 MED ORDER — TAMSULOSIN HCL 0.4 MG PO CAPS: 0.4000 mg | ORAL_CAPSULE | Freq: Two times a day (BID) | ORAL | 0 refills | Status: DC

## 2023-05-05 DIAGNOSIS — M542 Cervicalgia: Secondary | ICD-10-CM | POA: Diagnosis not present

## 2023-05-05 DIAGNOSIS — R2681 Unsteadiness on feet: Secondary | ICD-10-CM | POA: Diagnosis not present

## 2023-05-05 DIAGNOSIS — S060X0S Concussion without loss of consciousness, sequela: Secondary | ICD-10-CM | POA: Diagnosis not present

## 2023-05-07 DIAGNOSIS — R2681 Unsteadiness on feet: Secondary | ICD-10-CM | POA: Diagnosis not present

## 2023-05-07 DIAGNOSIS — M542 Cervicalgia: Secondary | ICD-10-CM | POA: Diagnosis not present

## 2023-05-07 DIAGNOSIS — S060X0S Concussion without loss of consciousness, sequela: Secondary | ICD-10-CM | POA: Diagnosis not present

## 2023-05-12 ENCOUNTER — Ambulatory Visit: Payer: BC Managed Care – PPO | Admitting: Sports Medicine

## 2023-05-14 DIAGNOSIS — R2681 Unsteadiness on feet: Secondary | ICD-10-CM | POA: Diagnosis not present

## 2023-05-14 DIAGNOSIS — S060X0S Concussion without loss of consciousness, sequela: Secondary | ICD-10-CM | POA: Diagnosis not present

## 2023-05-14 DIAGNOSIS — M542 Cervicalgia: Secondary | ICD-10-CM | POA: Diagnosis not present
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
## 2023-05-19 ENCOUNTER — Ambulatory Visit: Payer: BC Managed Care – PPO | Admitting: Sports Medicine

## 2023-05-19 VITALS — BP 122/68 | Ht 69.0 in | Wt 161.0 lb

## 2023-05-19 DIAGNOSIS — M542 Cervicalgia: Secondary | ICD-10-CM | POA: Diagnosis not present

## 2023-05-19 DIAGNOSIS — R4189 Other symptoms and signs involving cognitive functions and awareness: Secondary | ICD-10-CM

## 2023-05-19 DIAGNOSIS — R110 Nausea: Secondary | ICD-10-CM | POA: Diagnosis not present

## 2023-05-19 DIAGNOSIS — R270 Ataxia, unspecified: Secondary | ICD-10-CM

## 2023-05-19 DIAGNOSIS — R2681 Unsteadiness on feet: Secondary | ICD-10-CM | POA: Diagnosis not present

## 2023-05-19 DIAGNOSIS — S060X0D Concussion without loss of consciousness, subsequent encounter: Secondary | ICD-10-CM

## 2023-05-19 DIAGNOSIS — S060X0S Concussion without loss of consciousness, sequela: Secondary | ICD-10-CM | POA: Diagnosis not present

## 2023-05-19 MED ORDER — AMITRIPTYLINE HCL 10 MG PO TABS: 10.0000 mg | ORAL_TABLET | Freq: Every day | ORAL | 0 refills | Status: DC

## 2023-05-19 NOTE — Patient Instructions (Signed)
Start amitriptyline 10 mg for 1 week and if no negative side effects increase to 20 mg 4 hours max 3 weeks  3 week follow up

## 2023-06-04 DIAGNOSIS — S060X0S Concussion without loss of consciousness, sequela: Secondary | ICD-10-CM | POA: Diagnosis not present

## 2023-06-04 DIAGNOSIS — R2681 Unsteadiness on feet: Secondary | ICD-10-CM | POA: Diagnosis not present

## 2023-06-04 DIAGNOSIS — M542 Cervicalgia: Secondary | ICD-10-CM | POA: Diagnosis not present

## 2023-06-08 ENCOUNTER — Other Ambulatory Visit: Payer: Self-pay | Admitting: Sports Medicine
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
## 2023-06-09 ENCOUNTER — Ambulatory Visit (INDEPENDENT_AMBULATORY_CARE_PROVIDER_SITE_OTHER): Payer: BC Managed Care – PPO | Admitting: Sports Medicine

## 2023-06-09 VITALS — HR 66 | Ht 69.0 in | Wt 161.0 lb

## 2023-06-09 DIAGNOSIS — R2681 Unsteadiness on feet: Secondary | ICD-10-CM | POA: Diagnosis not present

## 2023-06-09 DIAGNOSIS — R270 Ataxia, unspecified: Secondary | ICD-10-CM

## 2023-06-09 DIAGNOSIS — R4189 Other symptoms and signs involving cognitive functions and awareness: Secondary | ICD-10-CM

## 2023-06-09 DIAGNOSIS — S060X0D Concussion without loss of consciousness, subsequent encounter: Secondary | ICD-10-CM

## 2023-06-09 DIAGNOSIS — R110 Nausea: Secondary | ICD-10-CM | POA: Diagnosis not present

## 2023-06-09 DIAGNOSIS — S060X0S Concussion without loss of consciousness, sequela: Secondary | ICD-10-CM | POA: Diagnosis not present

## 2023-06-09 DIAGNOSIS — M542 Cervicalgia: Secondary | ICD-10-CM | POA: Diagnosis not present

## 2023-06-09 MED ORDER — AMITRIPTYLINE HCL 25 MG PO TABS: 25.0000 mg | ORAL_TABLET | Freq: Every day | ORAL | 1 refills | Status: DC

## 2023-06-09 NOTE — Patient Instructions (Addendum)
 Amitriptyline 25 mg daily  Continue working 4 hours a day rest breaks as needed  3-4 week follow up

## 2023-06-10 ENCOUNTER — Other Ambulatory Visit: Payer: Self-pay | Admitting: Sports Medicine

## 2023-06-18 DIAGNOSIS — M542 Cervicalgia: Secondary | ICD-10-CM | POA: Diagnosis not present

## 2023-06-18 DIAGNOSIS — R2681 Unsteadiness on feet: Secondary | ICD-10-CM | POA: Diagnosis not present

## 2023-06-18 DIAGNOSIS — S060X0S Concussion without loss of consciousness, sequela: Secondary | ICD-10-CM | POA: Diagnosis not present

## 2023-06-25 DIAGNOSIS — R2681 Unsteadiness on feet: Secondary | ICD-10-CM | POA: Diagnosis not present

## 2023-06-25 DIAGNOSIS — M542 Cervicalgia: Secondary | ICD-10-CM | POA: Diagnosis not present

## 2023-06-25 DIAGNOSIS — S060X0S Concussion without loss of consciousness, sequela: Secondary | ICD-10-CM | POA: Diagnosis not present

## 2023-06-26 ENCOUNTER — Other Ambulatory Visit: Payer: Self-pay | Admitting: Family Medicine

## 2023-06-26 DIAGNOSIS — M545 Low back pain, unspecified: Secondary | ICD-10-CM

## 2023-06-26 DIAGNOSIS — R102 Pelvic and perineal pain: Secondary | ICD-10-CM

## 2023-06-26 MED ORDER — TAMSULOSIN HCL 0.4 MG PO CAPS: 0.4000 mg | ORAL_CAPSULE | Freq: Two times a day (BID) | ORAL | 0 refills | Status: DC

## 2023-06-30 DIAGNOSIS — R2681 Unsteadiness on feet: Secondary | ICD-10-CM | POA: Diagnosis not present

## 2023-06-30 DIAGNOSIS — M542 Cervicalgia: Secondary | ICD-10-CM | POA: Diagnosis not present

## 2023-06-30 DIAGNOSIS — S060X0S Concussion without loss of consciousness, sequela: Secondary | ICD-10-CM | POA: Diagnosis not present

## 2023-07-04 ENCOUNTER — Other Ambulatory Visit: Payer: Self-pay | Admitting: Sports Medicine

## 2023-07-08 DIAGNOSIS — S060X0S Concussion without loss of consciousness, sequela: Secondary | ICD-10-CM | POA: Diagnosis not present

## 2023-07-08 DIAGNOSIS — M542 Cervicalgia: Secondary | ICD-10-CM | POA: Diagnosis not present

## 2023-07-08 DIAGNOSIS — R2681 Unsteadiness on feet: Secondary | ICD-10-CM | POA: Diagnosis not present
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
## 2023-07-09 ENCOUNTER — Ambulatory Visit: Payer: BC Managed Care – PPO | Admitting: Sports Medicine

## 2023-07-09 VITALS — BP 118/70 | HR 95 | Ht 69.0 in | Wt 163.6 lb

## 2023-07-09 DIAGNOSIS — R110 Nausea: Secondary | ICD-10-CM | POA: Diagnosis not present

## 2023-07-09 DIAGNOSIS — R4189 Other symptoms and signs involving cognitive functions and awareness: Secondary | ICD-10-CM

## 2023-07-09 DIAGNOSIS — R270 Ataxia, unspecified: Secondary | ICD-10-CM

## 2023-07-09 DIAGNOSIS — S060X0D Concussion without loss of consciousness, subsequent encounter: Secondary | ICD-10-CM

## 2023-07-09 MED ORDER — AMITRIPTYLINE HCL 50 MG PO TABS: 50.0000 mg | ORAL_TABLET | Freq: Every day | ORAL | 0 refills | Status: DC

## 2023-07-09 NOTE — Patient Instructions (Addendum)
 Increase amitriptyline  to 50 mg daily. Work note provided: 4 hrs a day maximum Continue pt and vestibular Start a daily multivitamin. Get a blue light screen protection glasses or screen to increase eye strength. 3 week follow up.
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
## 2023-07-31 ENCOUNTER — Encounter: Payer: BC Managed Care – PPO | Admitting: Sports Medicine

## 2023-07-31 ENCOUNTER — Ambulatory Visit: Payer: BC Managed Care – PPO | Admitting: Sports Medicine

## 2023-07-31 VITALS — BP 124/82 | HR 87 | Ht 69.0 in | Wt 167.0 lb

## 2023-07-31 DIAGNOSIS — R270 Ataxia, unspecified: Secondary | ICD-10-CM

## 2023-07-31 DIAGNOSIS — S060X0D Concussion without loss of consciousness, subsequent encounter: Secondary | ICD-10-CM | POA: Diagnosis not present

## 2023-07-31 DIAGNOSIS — R4189 Other symptoms and signs involving cognitive functions and awareness: Secondary | ICD-10-CM

## 2023-07-31 MED ORDER — AMITRIPTYLINE HCL 75 MG PO TABS: 75.0000 mg | ORAL_TABLET | Freq: Every day | ORAL | 0 refills | Status: DC

## 2023-07-31 NOTE — Patient Instructions (Addendum)
 Increase amitriptyline 75 mg  Work note provided  3 week follow up

## 2023-08-06 DIAGNOSIS — M542 Cervicalgia: Secondary | ICD-10-CM | POA: Diagnosis not present

## 2023-08-06 DIAGNOSIS — R2681 Unsteadiness on feet: Secondary | ICD-10-CM | POA: Diagnosis not present

## 2023-08-06 DIAGNOSIS — S060X0S Concussion without loss of consciousness, sequela: Secondary | ICD-10-CM | POA: Diagnosis not present
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
## 2023-08-21 ENCOUNTER — Ambulatory Visit: Payer: BC Managed Care – PPO | Admitting: Sports Medicine

## 2023-08-21 VITALS — BP 126/76 | Ht 69.0 in | Wt 167.0 lb

## 2023-08-21 DIAGNOSIS — R4189 Other symptoms and signs involving cognitive functions and awareness: Secondary | ICD-10-CM

## 2023-08-21 DIAGNOSIS — R270 Ataxia, unspecified: Secondary | ICD-10-CM

## 2023-08-21 DIAGNOSIS — S060X0D Concussion without loss of consciousness, subsequent encounter: Secondary | ICD-10-CM | POA: Diagnosis not present

## 2023-08-21 DIAGNOSIS — R4701 Aphasia: Secondary | ICD-10-CM

## 2023-08-21 MED ORDER — AMITRIPTYLINE HCL 100 MG PO TABS: 100.0000 mg | ORAL_TABLET | Freq: Every day | ORAL | 0 refills | Status: DC

## 2023-08-21 MED ORDER — AMITRIPTYLINE HCL 25 MG PO TABS: 25.0000 mg | ORAL_TABLET | Freq: Every day | ORAL | 0 refills | Status: DC

## 2023-08-21 NOTE — Patient Instructions (Addendum)
 Continue 6 hours maximum 3 weeks  Continue vestibular therapy  Start speech therapy  Increase amitriptyline 100 mg if no significant improvement in 1 week then increase to 125 mg and if no significant improvement in 1 additional week increase to 150 mg 3 week follow up

## 2023-09-06 ENCOUNTER — Other Ambulatory Visit: Payer: Self-pay | Admitting: Sports Medicine

## 2023-09-07 MED ORDER — AMITRIPTYLINE HCL 150 MG PO TABS: 150.0000 mg | ORAL_TABLET | Freq: Every day | ORAL | 1 refills | Status: DC

## 2023-09-07 NOTE — Telephone Encounter (Signed)
 Patient Comment: Hello I followed the order of increasing dosage by 25 mg each week. As of 04/03 I am on a dose of 150MG . Tonight I only have 100mg  to take. Can you please help by sending script to pharmacy? Ty
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
## 2023-09-08 ENCOUNTER — Ambulatory Visit (INDEPENDENT_AMBULATORY_CARE_PROVIDER_SITE_OTHER): Admitting: Sports Medicine

## 2023-09-08 VITALS — HR 94 | Ht 69.0 in | Wt 167.0 lb

## 2023-09-08 DIAGNOSIS — R270 Ataxia, unspecified: Secondary | ICD-10-CM

## 2023-09-08 DIAGNOSIS — R4701 Aphasia: Secondary | ICD-10-CM

## 2023-09-08 DIAGNOSIS — R4189 Other symptoms and signs involving cognitive functions and awareness: Secondary | ICD-10-CM

## 2023-09-08 DIAGNOSIS — F0781 Postconcussional syndrome: Secondary | ICD-10-CM

## 2023-09-08 MED ORDER — AMANTADINE HCL 100 MG PO CAPS
100.0000 mg | ORAL_CAPSULE | Freq: Two times a day (BID) | ORAL | 1 refills | Status: DC
Start: 2023-09-08 — End: 2023-11-02

## 2023-09-08 NOTE — Patient Instructions (Addendum)
 Continue amitriptyline  150 mg nightly . Call for refill if needed  Start amantadine 100 mg 2x daily for 8 weeks end with 100 mg nightly the 9th week  3-4 week follow up  Work note provided 8 hours maximum rest breaks every hour as needed. Should go home if symptoms do not resolve

## 2023-09-10 ENCOUNTER — Encounter: Admitting: Sports Medicine

## 2023-09-24 ENCOUNTER — Ambulatory Visit: Attending: Sports Medicine

## 2023-09-24 ENCOUNTER — Other Ambulatory Visit: Payer: Self-pay

## 2023-09-24 DIAGNOSIS — F801 Expressive language disorder: Secondary | ICD-10-CM

## 2023-09-24 DIAGNOSIS — R4189 Other symptoms and signs involving cognitive functions and awareness: Secondary | ICD-10-CM | POA: Diagnosis not present

## 2023-09-24 DIAGNOSIS — R41841 Cognitive communication deficit: Secondary | ICD-10-CM | POA: Diagnosis not present

## 2023-09-24 DIAGNOSIS — R4701 Aphasia: Secondary | ICD-10-CM | POA: Insufficient documentation

## 2023-09-24 DIAGNOSIS — R270 Ataxia, unspecified: Secondary | ICD-10-CM | POA: Insufficient documentation

## 2023-09-24 DIAGNOSIS — S060X0D Concussion without loss of consciousness, subsequent encounter: Secondary | ICD-10-CM | POA: Diagnosis not present
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
## 2023-10-02 ENCOUNTER — Other Ambulatory Visit: Payer: Self-pay | Admitting: Sports Medicine
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
## 2023-10-08 ENCOUNTER — Ambulatory Visit: Admitting: Sports Medicine

## 2023-10-08 VITALS — HR 78 | Ht 69.0 in | Wt 167.0 lb

## 2023-10-08 DIAGNOSIS — R4189 Other symptoms and signs involving cognitive functions and awareness: Secondary | ICD-10-CM | POA: Diagnosis not present

## 2023-10-08 DIAGNOSIS — R4701 Aphasia: Secondary | ICD-10-CM

## 2023-10-08 DIAGNOSIS — F0781 Postconcussional syndrome: Secondary | ICD-10-CM | POA: Diagnosis not present

## 2023-10-08 DIAGNOSIS — R270 Ataxia, unspecified: Secondary | ICD-10-CM | POA: Diagnosis not present

## 2023-10-08 NOTE — Patient Instructions (Signed)
 Continue amitriptyline  150 mg nightly  Recommend reaching out a week before a refill is needed  Continue amantadine  100 mg 2 x daily for 6 weeks  Decrease to 100 mg daily for 7th week  And then stop medication  Work note provided 8 week follow up

## 2023-10-09 ENCOUNTER — Ambulatory Visit: Attending: Sports Medicine

## 2023-10-09 DIAGNOSIS — F801 Expressive language disorder: Secondary | ICD-10-CM | POA: Insufficient documentation

## 2023-10-09 DIAGNOSIS — R41841 Cognitive communication deficit: Secondary | ICD-10-CM | POA: Insufficient documentation
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
## 2023-10-20 ENCOUNTER — Telehealth: Payer: Self-pay

## 2023-10-20 ENCOUNTER — Ambulatory Visit

## 2023-10-20 NOTE — Telephone Encounter (Signed)
  Speech Therapy (ST) No-Show (2nd no-show) SLP called pt and reached her via phone number in [facility].  Pt apologized for missing today's apointment. SLP provided next appointment day and time and pt confirmed day/time and stated she would attend the appointment 10/22/23 at 1145.   Daphney Eans, MS, SLP   [facility] at [facility] [address] [place], [place] [address]  P: [phone] F: [phone]

## 2023-10-22 ENCOUNTER — Ambulatory Visit

## 2023-10-22 DIAGNOSIS — R41841 Cognitive communication deficit: Secondary | ICD-10-CM

## 2023-10-22 DIAGNOSIS — F801 Expressive language disorder: Secondary | ICD-10-CM
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
## 2023-10-22 NOTE — Patient Instructions (Addendum)

## 2023-10-27 ENCOUNTER — Ambulatory Visit

## 2023-10-30 ENCOUNTER — Ambulatory Visit

## 2023-11-01 ENCOUNTER — Other Ambulatory Visit: Payer: Self-pay | Admitting: Sports Medicine

## 2023-11-03 ENCOUNTER — Ambulatory Visit: Attending: Sports Medicine

## 2023-11-03 DIAGNOSIS — R41841 Cognitive communication deficit: Secondary | ICD-10-CM | POA: Insufficient documentation

## 2023-11-03 DIAGNOSIS — R482 Apraxia: Secondary | ICD-10-CM | POA: Insufficient documentation

## 2023-11-03 DIAGNOSIS — F801 Expressive language disorder: Secondary | ICD-10-CM | POA: Diagnosis not present
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
## 2023-11-05 ENCOUNTER — Ambulatory Visit

## 2023-11-05 ENCOUNTER — Other Ambulatory Visit: Payer: Self-pay | Admitting: Sports Medicine

## 2023-11-12 ENCOUNTER — Ambulatory Visit

## 2023-11-13 ENCOUNTER — Ambulatory Visit

## 2023-11-13 DIAGNOSIS — R41841 Cognitive communication deficit: Secondary | ICD-10-CM

## 2023-11-13 DIAGNOSIS — F801 Expressive language disorder: Secondary | ICD-10-CM | POA: Diagnosis not present

## 2023-11-13 DIAGNOSIS — R482 Apraxia: Secondary | ICD-10-CM | POA: Diagnosis not present
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
## 2023-11-19 ENCOUNTER — Ambulatory Visit

## 2023-11-19 DIAGNOSIS — R41841 Cognitive communication deficit: Secondary | ICD-10-CM | POA: Diagnosis not present

## 2023-11-19 DIAGNOSIS — F801 Expressive language disorder: Secondary | ICD-10-CM

## 2023-11-19 DIAGNOSIS — R482 Apraxia: Secondary | ICD-10-CM | POA: Diagnosis not present

## 2023-11-19 NOTE — Patient Instructions (Signed)
  Speech Exercises  Repeat these phrases 2 times, 2 times a day  Call the cat "Buttercup" A calendar of [place] Four floors to cover Yellow oil ointment Fellow lovers of felines Catastrophe in [place] Plump plumbers' plums The church's chimes chimed Telling time 'til eleven Five valve levers Keep the gate closed Go see that guy Fat cows give milk [employer] Data Gophers Fat frogs flip freely [employer] into bed Get that game to [employer] Thick thistles stick together Cinnamon aluminum linoleum Black bugs blood Lovely lemon linament Red leather, yellow leather  Big grocery buggy    Purple baby carriage [facility] [place] copper coffee pot Ripe purple cabbage Three free throws [employer] tackled  [employer] dipped the dessert  [employer] Buckle that [employer] Shirts shrink, shells shouldn't [place] [employer] Take the tackle box File the flash message Give me five flapjacks Fundamental relatives Dye the pets purple Talking [place] time after time Dark chocolate chunks Political landscape of the kingdom Actuary genius We played yo-yos yesterday
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
## 2023-12-08 ENCOUNTER — Ambulatory Visit: Attending: Sports Medicine

## 2023-12-08 DIAGNOSIS — R300 Dysuria: Secondary | ICD-10-CM | POA: Diagnosis not present

## 2023-12-08 DIAGNOSIS — L723 Sebaceous cyst: Secondary | ICD-10-CM | POA: Diagnosis not present
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
## 2023-12-10 ENCOUNTER — Ambulatory Visit: Admitting: Sports Medicine

## 2023-12-10 VITALS — HR 97 | Ht 69.0 in | Wt 163.0 lb

## 2023-12-10 DIAGNOSIS — R4189 Other symptoms and signs involving cognitive functions and awareness: Secondary | ICD-10-CM

## 2023-12-10 DIAGNOSIS — R270 Ataxia, unspecified: Secondary | ICD-10-CM | POA: Diagnosis not present

## 2023-12-10 DIAGNOSIS — F0781 Postconcussional syndrome: Secondary | ICD-10-CM | POA: Diagnosis not present

## 2023-12-10 DIAGNOSIS — R4701 Aphasia: Secondary | ICD-10-CM | POA: Diagnosis not present

## 2023-12-10 MED ORDER — AMITRIPTYLINE HCL 50 MG PO TABS: 50.0000 mg | ORAL_TABLET | Freq: Every day | ORAL | 0 refills | Status: DC

## 2023-12-10 NOTE — Patient Instructions (Signed)
 Increase amitriptyline  200 mg  Work note provided  Amitriptyline  refill  4 week follow up

## 2023-12-17 ENCOUNTER — Telehealth: Payer: Self-pay

## 2023-12-17 ENCOUNTER — Ambulatory Visit

## 2023-12-17 NOTE — Telephone Encounter (Signed)
 ST (Speech Therapy)- No Show SLP called pt and left message about today's no show with pt. She has had 2+ no-shows to date. SLP has been somewhat lenient with clinic attendance policy as appointment tracking is an example of the skills targeted in ST thus far.  Today's appointment was pt's last scheduled appointment so SLP told pt she could call and schedule x1/week x4 weeks if desired.  If pt does not call to schedule more appointments by 12/24/23 this course of ST will be d/c'd.  Lupita Connor, MS, SLP   [facility] [address] [place], [place] [address]  P: [phone] F: [phone]
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
## 2024-01-07 ENCOUNTER — Ambulatory Visit: Admitting: Sports Medicine

## 2024-01-07 ENCOUNTER — Ambulatory Visit: Admitting: Physician Assistant

## 2024-01-07 VITALS — HR 68 | Ht 69.0 in | Wt 163.0 lb

## 2024-01-07 DIAGNOSIS — R270 Ataxia, unspecified: Secondary | ICD-10-CM | POA: Diagnosis not present

## 2024-01-07 DIAGNOSIS — R4189 Other symptoms and signs involving cognitive functions and awareness: Secondary | ICD-10-CM | POA: Diagnosis not present

## 2024-01-07 DIAGNOSIS — F0781 Postconcussional syndrome: Secondary | ICD-10-CM | POA: Diagnosis not present

## 2024-01-07 DIAGNOSIS — R4701 Aphasia: Secondary | ICD-10-CM

## 2024-01-07 MED ORDER — AMITRIPTYLINE HCL 100 MG PO TABS: 100.0000 mg | ORAL_TABLET | Freq: Every day | ORAL | 0 refills | Status: DC

## 2024-01-07 MED ORDER — AMITRIPTYLINE HCL 100 MG PO TABS: 200.0000 mg | ORAL_TABLET | Freq: Every day | ORAL | 0 refills | Status: DC

## 2024-01-07 NOTE — Patient Instructions (Addendum)
 MRI brain   Can use remaining ativan  for claustrophobia   Amitriptyline  200 mg daily   Follow up 5 days after MRI to discuss results

## 2024-01-07 NOTE — Addendum Note (Signed)
 Addended by: GEROME ASHLEY SAUNDERS on: 01/07/2024 10:57 AM   Modules accepted: Orders

## 2024-01-12 ENCOUNTER — Telehealth: Payer: Self-pay | Admitting: Sports Medicine

## 2024-01-12 ENCOUNTER — Other Ambulatory Visit

## 2024-01-12 ENCOUNTER — Ambulatory Visit

## 2024-01-12 ENCOUNTER — Other Ambulatory Visit: Payer: Self-pay | Admitting: Sports Medicine

## 2024-01-12 DIAGNOSIS — R4701 Aphasia: Secondary | ICD-10-CM

## 2024-01-12 DIAGNOSIS — F0781 Postconcussional syndrome: Secondary | ICD-10-CM | POA: Diagnosis not present

## 2024-01-12 DIAGNOSIS — F40240 Claustrophobia: Secondary | ICD-10-CM

## 2024-01-12 DIAGNOSIS — R4189 Other symptoms and signs involving cognitive functions and awareness: Secondary | ICD-10-CM

## 2024-01-12 DIAGNOSIS — R270 Ataxia, unspecified: Secondary | ICD-10-CM

## 2024-01-12 MED ORDER — LORAZEPAM 0.5 MG PO TABS: ORAL_TABLET | ORAL | 0 refills | Status: AC

## 2024-01-12 MED ORDER — GADOBUTROL 1 MMOL/ML IV SOLN
7.3000 mL | Freq: Once | INTRAVENOUS | Status: AC | PRN
  Administered 2024-01-12 (×2): 7.3 mL via INTRAVENOUS

## 2024-01-12 NOTE — Progress Notes (Signed)
 Refill provided of Ativan  0.5 mg to use 1 to 2 tablets 30 minutes prior to MRI for anxiety claustrophobia.

## 2024-01-12 NOTE — Telephone Encounter (Signed)
 Pt has MRI at 10am today, thought she had valium at home but does not.  Can we call in valium to [facility] [address] and she will pickup on the way.

## 2024-01-12 NOTE — Telephone Encounter (Signed)
 Pt notified via VM at 9:20am today.

## 2024-01-15 ENCOUNTER — Ambulatory Visit: Admitting: Family Medicine

## 2024-01-18 ENCOUNTER — Ambulatory Visit: Admitting: Family Medicine

## 2024-01-18 ENCOUNTER — Encounter: Payer: Self-pay | Admitting: Family Medicine

## 2024-01-18 VITALS — BP 123/83 | HR 102 | Temp 99.1°F | Resp 18 | Ht 69.0 in | Wt 179.4 lb

## 2024-01-18 DIAGNOSIS — R102 Pelvic and perineal pain: Secondary | ICD-10-CM | POA: Diagnosis not present

## 2024-01-18 DIAGNOSIS — R339 Retention of urine, unspecified: Secondary | ICD-10-CM | POA: Diagnosis not present

## 2024-01-18 DIAGNOSIS — M545 Low back pain, unspecified: Secondary | ICD-10-CM

## 2024-01-18 DIAGNOSIS — R338 Other retention of urine: Secondary | ICD-10-CM | POA: Diagnosis not present

## 2024-01-18 LAB — POC URINALSYSI DIPSTICK (AUTOMATED)
Bilirubin, UA: NEGATIVE
Blood, UA: NEGATIVE
Glucose, UA: NEGATIVE
Ketones, UA: NEGATIVE
Leukocytes, UA: NEGATIVE
Nitrite, UA: NEGATIVE
Protein, UA: NEGATIVE
Spec Grav, UA: 1.015 (ref 1.010–1.025)
Urobilinogen, UA: 0.2 U/dL
pH, UA: 6.5 (ref 5.0–8.0)

## 2024-01-18 MED ORDER — TAMSULOSIN HCL 0.4 MG PO CAPS: 0.4000 mg | ORAL_CAPSULE | Freq: Every day | ORAL | 0 refills | Status: DC

## 2024-01-18 NOTE — Patient Instructions (Signed)
 It was very nice to see you today!  [facility] [address] [place], [place], [place] [address] [phone]    PLEASE NOTE:  If you had any lab tests please let us know if you have not heard back within a few days. You may see your results on MyChart before we have a chance to review them but we will give you a call once they are reviewed by [place]. If we ordered any referrals today, please let us know if you have not heard from their office within the next week.   Please try these tips to maintain a healthy lifestyle:  Eat most of your calories during the day when you are active. Eliminate processed foods including packaged sweets (pies, cakes, cookies), reduce intake of potatoes, white bread, white pasta, and white rice. Look for whole grain options, oat flour or almond flour.  Each meal should contain half fruits/vegetables, one quarter protein, and one quarter carbs (no bigger than a computer mouse).  Cut down on sweet beverages. This includes juice, soda, and sweet tea. Also watch fruit intake, though this is a healthier sweet option, it still contains natural sugar! Limit to 3 servings daily.  Drink at least 1 glass of water with each meal and aim for at least 8 glasses per day  Exercise at least 150 minutes every week.
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
## 2024-01-19 ENCOUNTER — Ambulatory Visit: Payer: Self-pay | Admitting: Family Medicine

## 2024-01-19 LAB — URINE CULTURE
MICRO NUMBER:: 16845585
SPECIMEN QUALITY:: ADEQUATE

## 2024-01-19 NOTE — Progress Notes (Signed)
 Doesn't look like infection.  Does she know what meds gyn put her on and if that culture showed anything?

## 2024-01-21 ENCOUNTER — Ambulatory Visit: Payer: Self-pay | Admitting: Sports Medicine
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
## 2024-01-26 ENCOUNTER — Ambulatory Visit: Admitting: Sports Medicine

## 2024-01-27 ENCOUNTER — Ambulatory Visit (INDEPENDENT_AMBULATORY_CARE_PROVIDER_SITE_OTHER): Admitting: Sports Medicine

## 2024-01-27 VITALS — HR 70 | Ht 69.0 in | Wt 179.0 lb

## 2024-01-27 DIAGNOSIS — R270 Ataxia, unspecified: Secondary | ICD-10-CM

## 2024-01-27 DIAGNOSIS — R4701 Aphasia: Secondary | ICD-10-CM

## 2024-01-27 DIAGNOSIS — R4189 Other symptoms and signs involving cognitive functions and awareness: Secondary | ICD-10-CM

## 2024-01-27 DIAGNOSIS — F0781 Postconcussional syndrome: Secondary | ICD-10-CM

## 2024-01-27 MED ORDER — AMPHETAMINE-DEXTROAMPHET ER 10 MG PO CP24: 10.0000 mg | ORAL_CAPSULE | Freq: Every day | ORAL | 0 refills | Status: DC

## 2024-01-27 NOTE — Patient Instructions (Addendum)
 Adderral 10 mg daily   Labs on the way out   4 week follow up
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
## 2024-01-28 ENCOUNTER — Ambulatory Visit: Payer: Self-pay | Admitting: Sports Medicine

## 2024-01-28 LAB — DRUG MONITORING, PANEL 8 WITH CONFIRMATION, URINE
6 Acetylmorphine: NEGATIVE ng/mL (ref <10)
Alcohol Metabolites: NEGATIVE ng/mL (ref <500)
Amphetamines: NEGATIVE ng/mL (ref <500)
Benzodiazepines: NEGATIVE ng/mL (ref <100)
Buprenorphine, Urine: NEGATIVE ng/mL (ref <5)
Cocaine Metabolite: NEGATIVE ng/mL (ref <150)
Creatinine: 83.3 mg/dL (ref >=20.0)
MDMA: NEGATIVE ng/mL (ref <500)
Marijuana Metabolite: NEGATIVE ng/mL (ref <20)
Opiates: NEGATIVE ng/mL (ref <100)
Oxidant: NEGATIVE ug/mL (ref <200)
Oxycodone: NEGATIVE ng/mL (ref <100)
pH: 7.9 (ref 4.5–9.0)

## 2024-01-28 LAB — DM TEMPLATE

## 2024-02-09 ENCOUNTER — Other Ambulatory Visit: Payer: Self-pay | Admitting: Sports Medicine

## 2024-02-09 DIAGNOSIS — R270 Ataxia, unspecified: Secondary | ICD-10-CM

## 2024-02-09 DIAGNOSIS — R4701 Aphasia: Secondary | ICD-10-CM

## 2024-02-09 DIAGNOSIS — R4189 Other symptoms and signs involving cognitive functions and awareness: Secondary | ICD-10-CM

## 2024-02-09 DIAGNOSIS — F0781 Postconcussional syndrome: Secondary | ICD-10-CM
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
## 2024-02-25 ENCOUNTER — Ambulatory Visit: Admitting: Sports Medicine
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
## 2024-03-08 ENCOUNTER — Ambulatory Visit (INDEPENDENT_AMBULATORY_CARE_PROVIDER_SITE_OTHER): Admitting: Sports Medicine

## 2024-03-08 VITALS — HR 94 | Ht 69.0 in | Wt 179.0 lb

## 2024-03-08 DIAGNOSIS — F0781 Postconcussional syndrome: Secondary | ICD-10-CM | POA: Diagnosis not present

## 2024-03-08 DIAGNOSIS — R270 Ataxia, unspecified: Secondary | ICD-10-CM

## 2024-03-08 DIAGNOSIS — R4189 Other symptoms and signs involving cognitive functions and awareness: Secondary | ICD-10-CM | POA: Diagnosis not present

## 2024-03-08 DIAGNOSIS — R4701 Aphasia: Secondary | ICD-10-CM | POA: Diagnosis not present

## 2024-03-08 MED ORDER — AMPHETAMINE-DEXTROAMPHET ER 10 MG PO CP24: 10.0000 mg | ORAL_CAPSULE | ORAL | 0 refills | Status: AC

## 2024-03-08 MED ORDER — AMITRIPTYLINE HCL 50 MG PO TABS: 50.0000 mg | ORAL_TABLET | Freq: Every day | ORAL | 0 refills | Status: DC

## 2024-03-08 NOTE — Patient Instructions (Signed)
 Thank you for coming in today  We will begin tapering amitriptyline  with the following plan:  - Start by alternating amitriptyline  200 mg daily and amitriptyline  150 mg daily for 2 weeks - If no negative side effects, use amitriptyline  150 mg daily for 2 weeks. - If no negative side effects, alternate amitriptyline  150 mg daily and amitriptyline  100 mg daily for 2 weeks. - If no negative side effects, use amitriptyline  100 mg daily.  Our goal is to find the lowest dose of medication that is effective in treating her symptoms, but does not cause side effects.  Please call and let us  know what dose is the most effective for you and we will send an additional refill at that level.  Continue Adderall XR 10 mg daily  Will reach out with subspecialist referral options.  Follow-up in 3 months.

## 2024-03-28 ENCOUNTER — Encounter: Payer: Self-pay | Admitting: Sports Medicine

## 2024-04-11 ENCOUNTER — Other Ambulatory Visit: Payer: Self-pay | Admitting: Sports Medicine

## 2024-04-11 DIAGNOSIS — R4701 Aphasia: Secondary | ICD-10-CM

## 2024-04-11 DIAGNOSIS — S060X0A Concussion without loss of consciousness, initial encounter: Secondary | ICD-10-CM

## 2024-04-11 DIAGNOSIS — S060X0D Concussion without loss of consciousness, subsequent encounter: Secondary | ICD-10-CM

## 2024-04-11 DIAGNOSIS — F0781 Postconcussional syndrome: Secondary | ICD-10-CM

## 2024-04-11 DIAGNOSIS — R4189 Other symptoms and signs involving cognitive functions and awareness: Secondary | ICD-10-CM

## 2024-04-11 NOTE — Progress Notes (Unsigned)
 Referral sent

## 2024-04-11 NOTE — Telephone Encounter (Signed)
 Referral placed.

## 2024-04-15 ENCOUNTER — Encounter: Payer: Self-pay | Admitting: Family Medicine

## 2024-04-15 ENCOUNTER — Ambulatory Visit (INDEPENDENT_AMBULATORY_CARE_PROVIDER_SITE_OTHER): Admitting: Family Medicine

## 2024-04-15 VITALS — BP 122/80 | HR 76 | Temp 98.2°F | Ht 69.0 in | Wt 174.4 lb

## 2024-04-15 DIAGNOSIS — Z13220 Encounter for screening for lipoid disorders: Secondary | ICD-10-CM

## 2024-04-15 DIAGNOSIS — Z131 Encounter for screening for diabetes mellitus: Secondary | ICD-10-CM | POA: Diagnosis not present

## 2024-04-15 DIAGNOSIS — Z8249 Family history of ischemic heart disease and other diseases of the circulatory system: Secondary | ICD-10-CM

## 2024-04-15 DIAGNOSIS — Z1159 Encounter for screening for other viral diseases: Secondary | ICD-10-CM | POA: Diagnosis not present

## 2024-04-15 DIAGNOSIS — Z789 Other specified health status: Secondary | ICD-10-CM | POA: Diagnosis not present

## 2024-04-15 DIAGNOSIS — M545 Low back pain, unspecified: Secondary | ICD-10-CM

## 2024-04-15 DIAGNOSIS — Z0000 Encounter for general adult medical examination without abnormal findings: Secondary | ICD-10-CM

## 2024-04-15 DIAGNOSIS — R102 Pelvic and perineal pain unspecified side: Secondary | ICD-10-CM

## 2024-04-15 LAB — CBC WITH DIFFERENTIAL/PLATELET
Basophils Absolute: 0 K/uL (ref 0.0–0.1)
Basophils Relative: 0.6 % (ref 0.0–3.0)
Eosinophils Absolute: 0.1 K/uL (ref 0.0–0.7)
Eosinophils Relative: 1.7 % (ref 0.0–5.0)
HCT: 38.9 % (ref 36.0–46.0)
Hemoglobin: 12.8 g/dL (ref 12.0–15.0)
Lymphocytes Relative: 43.3 % (ref 12.0–46.0)
Lymphs Abs: 1.8 K/uL (ref 0.7–4.0)
MCHC: 32.9 g/dL (ref 30.0–36.0)
MCV: 83.1 fl (ref 78.0–100.0)
Monocytes Absolute: 0.3 K/uL (ref 0.1–1.0)
Monocytes Relative: 7.7 % (ref 3.0–12.0)
Neutro Abs: 2 K/uL (ref 1.4–7.7)
Neutrophils Relative %: 46.7 % (ref 43.0–77.0)
Platelets: 296 K/uL (ref 150.0–400.0)
RBC: 4.68 Mil/uL (ref 3.87–5.11)
RDW: 15 % (ref 11.5–15.5)
WBC: 4.2 K/uL (ref 4.0–10.5)

## 2024-04-15 LAB — COMPREHENSIVE METABOLIC PANEL WITH GFR
ALT: 14 U/L (ref 0–35)
AST: 18 U/L (ref 0–37)
Albumin: 4.5 g/dL (ref 3.5–5.2)
Alkaline Phosphatase: 57 U/L (ref 39–117)
BUN: 8 mg/dL (ref 6–23)
CO2: 28 meq/L (ref 19–32)
Calcium: 9.5 mg/dL (ref 8.4–10.5)
Chloride: 103 meq/L (ref 96–112)
Creatinine, Ser: 0.79 mg/dL (ref 0.40–1.20)
GFR: 88.61 mL/min (ref >60.00)
Glucose, Bld: 97 mg/dL (ref 70–99)
Potassium: 4.2 meq/L (ref 3.5–5.1)
Sodium: 138 meq/L (ref 135–145)
Total Bilirubin: 0.5 mg/dL (ref 0.2–1.2)
Total Protein: 7.2 g/dL (ref 6.0–8.3)

## 2024-04-15 LAB — LIPID PANEL
Cholesterol: 162 mg/dL (ref 0–200)
HDL: 63 mg/dL (ref >39.00)
LDL Cholesterol: 83 mg/dL (ref 0–99)
NonHDL: 98.83
Total CHOL/HDL Ratio: 3
Triglycerides: 78 mg/dL (ref 0.0–149.0)
VLDL: 15.6 mg/dL (ref 0.0–40.0)

## 2024-04-15 LAB — VITAMIN B12: Vitamin B-12: 634 pg/mL (ref 211–911)

## 2024-04-15 LAB — VITAMIN D 25 HYDROXY (VIT D DEFICIENCY, FRACTURES): VITD: 19.29 ng/mL — ABNORMAL LOW (ref 30.00–100.00)

## 2024-04-15 LAB — TSH: TSH: 0.41 u[IU]/mL (ref 0.35–5.50)

## 2024-04-15 MED ORDER — TAMSULOSIN HCL 0.4 MG PO CAPS: 0.4000 mg | ORAL_CAPSULE | Freq: Every day | ORAL | 3 refills | Status: AC
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
## 2024-04-15 NOTE — Patient Instructions (Addendum)
 It was very nice to see you today!  Happy Holidays  Take B12 vitamins   PLEASE NOTE:  If you had any lab tests please let us  know if you have not heard back within a few days. You may see your results on MyChart before we have a chance to review them but we will give you a call once they are reviewed by us . If we ordered any referrals today, please let us  know if you have not heard from their office within the next week.   Please try these tips to maintain a healthy lifestyle:  Eat most of your calories during the day when you are active. Eliminate processed foods including packaged sweets (pies, cakes, cookies), reduce intake of potatoes, white bread, white pasta, and white rice. Look for whole grain options, oat flour or almond flour.  Each meal should contain half fruits/vegetables, one quarter protein, and one quarter carbs (no bigger than a computer mouse).  Cut down on sweet beverages. This includes juice, soda, and sweet tea. Also watch fruit intake, though this is a healthier sweet option, it still contains natural sugar! Limit to 3 servings daily.  Drink at least 1 glass of water with each meal and aim for at least 8 glasses per day  Exercise at least 150 minutes every week.

## 2024-04-18 ENCOUNTER — Ambulatory Visit: Payer: Self-pay | Admitting: Family Medicine

## 2024-04-18 LAB — HEMOGLOBIN A1C: Hgb A1c MFr Bld: 6.2 % (ref 4.6–6.5)

## 2024-04-18 NOTE — Progress Notes (Signed)
 Labs great except  A1C(3 month average of sugars) is elevated.  This is considered PreDiabetes.  Work on diet-decrease sugars and starches and aim for 30 minutes of exercise 5 days/week to prevent progression to diabetes  B12 ok but still take maybe 2-3 times weekly Vitamin D  low-take 2000iu/d otc

## 2024-04-19 NOTE — Progress Notes (Signed)
 Pt has read results smk

## 2024-04-21 LAB — LIPOPROTEIN A (LPA): Lipoprotein (a): 46 nmol/L (ref <75)

## 2024-04-21 LAB — HEPATITIS C ANTIBODY: Hepatitis C Ab: NONREACTIVE

## 2024-05-04 ENCOUNTER — Other Ambulatory Visit: Payer: Self-pay | Admitting: Sports Medicine

## 2024-05-04 ENCOUNTER — Telehealth: Payer: Self-pay | Admitting: Sports Medicine

## 2024-05-04 DIAGNOSIS — S060X0A Concussion without loss of consciousness, initial encounter: Secondary | ICD-10-CM

## 2024-05-04 DIAGNOSIS — F0781 Postconcussional syndrome: Secondary | ICD-10-CM

## 2024-05-04 DIAGNOSIS — S46812A Strain of other muscles, fascia and tendons at shoulder and upper arm level, left arm, initial encounter: Secondary | ICD-10-CM

## 2024-05-04 DIAGNOSIS — R110 Nausea: Secondary | ICD-10-CM

## 2024-05-04 DIAGNOSIS — M542 Cervicalgia: Secondary | ICD-10-CM

## 2024-05-04 DIAGNOSIS — R4701 Aphasia: Secondary | ICD-10-CM

## 2024-05-04 DIAGNOSIS — R4189 Other symptoms and signs involving cognitive functions and awareness: Secondary | ICD-10-CM

## 2024-05-04 DIAGNOSIS — F40240 Claustrophobia: Secondary | ICD-10-CM

## 2024-05-04 DIAGNOSIS — S060X0D Concussion without loss of consciousness, subsequent encounter: Secondary | ICD-10-CM

## 2024-05-04 DIAGNOSIS — R270 Ataxia, unspecified: Secondary | ICD-10-CM

## 2024-05-04 NOTE — Progress Notes (Signed)
 Referral placed.
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
## 2024-05-10 ENCOUNTER — Other Ambulatory Visit: Payer: Self-pay | Admitting: Sports Medicine

## 2024-05-10 DIAGNOSIS — S060X0A Concussion without loss of consciousness, initial encounter: Secondary | ICD-10-CM

## 2024-05-10 DIAGNOSIS — R4189 Other symptoms and signs involving cognitive functions and awareness: Secondary | ICD-10-CM

## 2024-05-10 DIAGNOSIS — S060X0D Concussion without loss of consciousness, subsequent encounter: Secondary | ICD-10-CM

## 2024-05-10 DIAGNOSIS — F0781 Postconcussional syndrome: Secondary | ICD-10-CM

## 2024-05-10 NOTE — Progress Notes (Signed)
 Referral sent

## 2024-05-19 DIAGNOSIS — R531 Weakness: Secondary | ICD-10-CM | POA: Diagnosis not present

## 2024-05-19 DIAGNOSIS — R2681 Unsteadiness on feet: Secondary | ICD-10-CM | POA: Diagnosis not present

## 2024-05-19 DIAGNOSIS — R42 Dizziness and giddiness: Secondary | ICD-10-CM | POA: Diagnosis not present

## 2024-05-19 DIAGNOSIS — R293 Abnormal posture: Secondary | ICD-10-CM | POA: Diagnosis not present

## 2024-05-20 DIAGNOSIS — R42 Dizziness and giddiness: Secondary | ICD-10-CM | POA: Diagnosis not present

## 2024-05-20 DIAGNOSIS — R293 Abnormal posture: Secondary | ICD-10-CM | POA: Diagnosis not present

## 2024-05-20 DIAGNOSIS — R2681 Unsteadiness on feet: Secondary | ICD-10-CM | POA: Diagnosis not present

## 2024-05-20 DIAGNOSIS — R531 Weakness: Secondary | ICD-10-CM | POA: Diagnosis not present

## 2024-05-24 DIAGNOSIS — R41841 Cognitive communication deficit: Secondary | ICD-10-CM

## 2024-05-24 DIAGNOSIS — R531 Weakness: Secondary | ICD-10-CM | POA: Diagnosis not present

## 2024-05-24 DIAGNOSIS — R482 Apraxia: Secondary | ICD-10-CM

## 2024-05-24 DIAGNOSIS — F801 Expressive language disorder: Secondary | ICD-10-CM

## 2024-05-24 DIAGNOSIS — R293 Abnormal posture: Secondary | ICD-10-CM | POA: Diagnosis not present

## 2024-05-24 DIAGNOSIS — R2681 Unsteadiness on feet: Secondary | ICD-10-CM | POA: Diagnosis not present

## 2024-05-24 DIAGNOSIS — R42 Dizziness and giddiness: Secondary | ICD-10-CM | POA: Diagnosis not present
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
## 2024-05-31 ENCOUNTER — Ambulatory Visit

## 2024-05-31 DIAGNOSIS — R42 Dizziness and giddiness: Secondary | ICD-10-CM | POA: Diagnosis not present

## 2024-05-31 DIAGNOSIS — F0781 Postconcussional syndrome: Secondary | ICD-10-CM | POA: Diagnosis not present

## 2024-05-31 DIAGNOSIS — S060X0A Concussion without loss of consciousness, initial encounter: Secondary | ICD-10-CM | POA: Insufficient documentation

## 2024-05-31 DIAGNOSIS — R4189 Other symptoms and signs involving cognitive functions and awareness: Secondary | ICD-10-CM | POA: Diagnosis not present

## 2024-05-31 DIAGNOSIS — R2681 Unsteadiness on feet: Secondary | ICD-10-CM | POA: Diagnosis not present

## 2024-05-31 DIAGNOSIS — R293 Abnormal posture: Secondary | ICD-10-CM | POA: Diagnosis not present

## 2024-05-31 DIAGNOSIS — R531 Weakness: Secondary | ICD-10-CM | POA: Diagnosis not present

## 2024-05-31 DIAGNOSIS — R41841 Cognitive communication deficit: Secondary | ICD-10-CM | POA: Diagnosis not present

## 2024-05-31 DIAGNOSIS — S060X0D Concussion without loss of consciousness, subsequent encounter: Secondary | ICD-10-CM | POA: Diagnosis not present

## 2024-06-01 ENCOUNTER — Other Ambulatory Visit: Payer: Self-pay
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
## 2024-06-09 ENCOUNTER — Ambulatory Visit (INDEPENDENT_AMBULATORY_CARE_PROVIDER_SITE_OTHER): Admitting: Sports Medicine

## 2024-06-09 ENCOUNTER — Ambulatory Visit: Attending: Sports Medicine

## 2024-06-09 VITALS — Ht 69.0 in | Wt 174.0 lb

## 2024-06-09 DIAGNOSIS — F4329 Adjustment disorder with other symptoms: Secondary | ICD-10-CM | POA: Diagnosis not present

## 2024-06-09 DIAGNOSIS — R270 Ataxia, unspecified: Secondary | ICD-10-CM | POA: Diagnosis not present

## 2024-06-09 DIAGNOSIS — R4189 Other symptoms and signs involving cognitive functions and awareness: Secondary | ICD-10-CM

## 2024-06-09 DIAGNOSIS — H53149 Visual discomfort, unspecified: Secondary | ICD-10-CM

## 2024-06-09 DIAGNOSIS — R41841 Cognitive communication deficit: Secondary | ICD-10-CM | POA: Diagnosis present

## 2024-06-09 DIAGNOSIS — F801 Expressive language disorder: Secondary | ICD-10-CM | POA: Diagnosis present

## 2024-06-09 DIAGNOSIS — R4701 Aphasia: Secondary | ICD-10-CM | POA: Diagnosis not present

## 2024-06-09 DIAGNOSIS — S060X0D Concussion without loss of consciousness, subsequent encounter: Secondary | ICD-10-CM | POA: Diagnosis not present
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
## 2024-06-09 NOTE — Patient Instructions (Signed)
 Stop amitriptyline   Continue adderral as needed   Reach back out to [facility] concussion clinic. To make an appointment, and or be added to a cancellation list   Mental health referral   Decrease frequency of speech and vestibular therapy to 1-2 times per week   2-3 month follow up

## 2024-06-14 ENCOUNTER — Ambulatory Visit

## 2024-06-14 ENCOUNTER — Other Ambulatory Visit: Payer: Self-pay | Admitting: Sports Medicine

## 2024-06-14 NOTE — Progress Notes (Signed)
 Patient has discontinued amitriptyline . No refills needed

## 2024-06-23 ENCOUNTER — Ambulatory Visit

## 2024-06-23 DIAGNOSIS — F801 Expressive language disorder: Secondary | ICD-10-CM

## 2024-06-23 DIAGNOSIS — R41841 Cognitive communication deficit: Secondary | ICD-10-CM
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
## 2024-06-30 ENCOUNTER — Ambulatory Visit

## 2024-07-08 ENCOUNTER — Encounter: Payer: Self-pay | Admitting: Sports Medicine

## 2024-08-18 ENCOUNTER — Encounter: Admitting: Sports Medicine

## 2025-04-21 ENCOUNTER — Encounter: Admitting: Family Medicine
